# Patient Record
Sex: Female | Born: 1945 | Race: White | Hispanic: No | Smoking: Current every day smoker
Health system: Southern US, Community
[De-identification: ages and names within clinical notes are randomized; demographics above are authoritative.]

## PROBLEM LIST (undated history)

## (undated) DIAGNOSIS — H9190 Unspecified hearing loss, unspecified ear: Secondary | ICD-10-CM

## (undated) DIAGNOSIS — J3489 Other specified disorders of nose and nasal sinuses: Secondary | ICD-10-CM

## (undated) DIAGNOSIS — J342 Deviated nasal septum: Secondary | ICD-10-CM

## (undated) DIAGNOSIS — C4490 Unspecified malignant neoplasm of skin, unspecified: Secondary | ICD-10-CM

## (undated) DIAGNOSIS — F32A Depression, unspecified: Secondary | ICD-10-CM

## (undated) DIAGNOSIS — M25562 Pain in left knee: Secondary | ICD-10-CM

## (undated) DIAGNOSIS — M7120 Synovial cyst of popliteal space [Baker], unspecified knee: Secondary | ICD-10-CM

## (undated) DIAGNOSIS — F329 Major depressive disorder, single episode, unspecified: Secondary | ICD-10-CM

## (undated) DIAGNOSIS — G4730 Sleep apnea, unspecified: Secondary | ICD-10-CM

## (undated) DIAGNOSIS — K5792 Diverticulitis of intestine, part unspecified, without perforation or abscess without bleeding: Principal | ICD-10-CM

## (undated) HISTORY — PX: NECK SURGERY: SHX720

## (undated) HISTORY — DX: Diverticulitis of intestine, part unspecified, without perforation or abscess without bleeding: K57.92

## (undated) HISTORY — PX: MOHS SURGERY: SHX181

## (undated) HISTORY — PX: APPENDECTOMY: SHX54

## (undated) HISTORY — PX: BACK SURGERY: SHX140

## (undated) HISTORY — PX: BREAST SURGERY: SHX581

## (undated) HISTORY — PX: EYE SURGERY: SHX253

## (undated) HISTORY — DX: Pain in left knee: M25.562

## (undated) HISTORY — PX: FOOT SURGERY: SHX648

---

## 2002-02-14 ENCOUNTER — Emergency Department (HOSPITAL_COMMUNITY): Admission: EM | Admit: 2002-02-14 | Discharge: 2002-02-14 | Payer: Self-pay | Admitting: Emergency Medicine

## 2002-06-02 ENCOUNTER — Encounter: Admission: RE | Admit: 2002-06-02 | Discharge: 2002-08-05 | Payer: Self-pay | Admitting: Orthopedic Surgery

## 2011-10-23 DIAGNOSIS — Z720 Tobacco use: Secondary | ICD-10-CM | POA: Insufficient documentation

## 2011-10-23 DIAGNOSIS — F419 Anxiety disorder, unspecified: Secondary | ICD-10-CM | POA: Insufficient documentation

## 2011-10-23 DIAGNOSIS — G8929 Other chronic pain: Secondary | ICD-10-CM | POA: Insufficient documentation

## 2011-11-13 DIAGNOSIS — E559 Vitamin D deficiency, unspecified: Secondary | ICD-10-CM | POA: Insufficient documentation

## 2011-11-16 DIAGNOSIS — M8580 Other specified disorders of bone density and structure, unspecified site: Secondary | ICD-10-CM | POA: Insufficient documentation

## 2011-11-19 DIAGNOSIS — M1610 Unilateral primary osteoarthritis, unspecified hip: Secondary | ICD-10-CM | POA: Insufficient documentation

## 2011-11-25 ENCOUNTER — Encounter (HOSPITAL_BASED_OUTPATIENT_CLINIC_OR_DEPARTMENT_OTHER): Payer: Self-pay | Admitting: *Deleted

## 2011-11-25 ENCOUNTER — Emergency Department (HOSPITAL_BASED_OUTPATIENT_CLINIC_OR_DEPARTMENT_OTHER)
Admission: EM | Admit: 2011-11-25 | Discharge: 2011-11-25 | Disposition: A | Discharge status: Home / Self Care | Payer: Medicare Other | Attending: Emergency Medicine | Admitting: Emergency Medicine

## 2011-11-25 ENCOUNTER — Emergency Department (HOSPITAL_BASED_OUTPATIENT_CLINIC_OR_DEPARTMENT_OTHER): Payer: Medicare Other

## 2011-11-25 DIAGNOSIS — S5010XA Contusion of unspecified forearm, initial encounter: Secondary | ICD-10-CM | POA: Insufficient documentation

## 2011-11-25 DIAGNOSIS — Z79899 Other long term (current) drug therapy: Secondary | ICD-10-CM | POA: Insufficient documentation

## 2011-11-25 DIAGNOSIS — R51 Headache: Secondary | ICD-10-CM | POA: Insufficient documentation

## 2011-11-25 DIAGNOSIS — R079 Chest pain, unspecified: Secondary | ICD-10-CM | POA: Insufficient documentation

## 2011-11-25 DIAGNOSIS — S40029A Contusion of unspecified upper arm, initial encounter: Secondary | ICD-10-CM

## 2011-11-25 HISTORY — DX: Other specified disorders of nose and nasal sinuses: J34.89

## 2011-11-25 MED ORDER — HYDROCODONE-ACETAMINOPHEN 5-325 MG PO TABS
1.0000 | ORAL_TABLET | Freq: Once | ORAL | Status: AC
  Administered 2011-11-25: 1 via ORAL
  Filled 2011-11-25: qty 1

## 2011-11-25 MED ORDER — HYDROCODONE-ACETAMINOPHEN 5-325 MG PO TABS: 1.0000 | ORAL_TABLET | Freq: Four times a day (QID) | ORAL | Status: AC | PRN

## 2011-11-25 MED ORDER — IBUPROFEN 800 MG PO TABS: 800.0000 mg | ORAL_TABLET | Freq: Three times a day (TID) | ORAL | Status: AC

## 2011-11-25 MED ORDER — METHOCARBAMOL 500 MG PO TABS: 500.0000 mg | ORAL_TABLET | Freq: Two times a day (BID) | ORAL | Status: AC

## 2011-11-25 MED ORDER — METHOCARBAMOL 500 MG PO TABS
500.0000 mg | ORAL_TABLET | Freq: Once | ORAL | Status: AC
  Administered 2011-11-25: 500 mg via ORAL
  Filled 2011-11-25: qty 1
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
## 2011-11-26 NOTE — ED Provider Notes (Signed)
History/physical exam/procedure(s) were performed by non-physician practitioner and as supervising physician I was immediately available for consultation/collaboration. I have reviewed all notes and am in agreement with care and plan.   Deva Ron S Marjon Doxtater, MD 11/26/11 0757 

## 2012-01-18 DIAGNOSIS — J449 Chronic obstructive pulmonary disease, unspecified: Secondary | ICD-10-CM | POA: Insufficient documentation

## 2012-01-18 DIAGNOSIS — G4733 Obstructive sleep apnea (adult) (pediatric): Secondary | ICD-10-CM | POA: Insufficient documentation

## 2012-05-05 ENCOUNTER — Emergency Department (HOSPITAL_BASED_OUTPATIENT_CLINIC_OR_DEPARTMENT_OTHER)
Admission: EM | Admit: 2012-05-05 | Discharge: 2012-05-05 | Disposition: A | Discharge status: Home / Self Care | Payer: Medicare Other | Attending: Emergency Medicine | Admitting: Emergency Medicine

## 2012-05-05 ENCOUNTER — Encounter (HOSPITAL_BASED_OUTPATIENT_CLINIC_OR_DEPARTMENT_OTHER): Payer: Self-pay | Admitting: Emergency Medicine

## 2012-05-05 DIAGNOSIS — Z9889 Other specified postprocedural states: Secondary | ICD-10-CM | POA: Insufficient documentation

## 2012-05-05 DIAGNOSIS — Z8669 Personal history of other diseases of the nervous system and sense organs: Secondary | ICD-10-CM | POA: Insufficient documentation

## 2012-05-05 DIAGNOSIS — Z7982 Long term (current) use of aspirin: Secondary | ICD-10-CM | POA: Insufficient documentation

## 2012-05-05 DIAGNOSIS — H209 Unspecified iridocyclitis: Secondary | ICD-10-CM | POA: Insufficient documentation

## 2012-05-05 DIAGNOSIS — F17200 Nicotine dependence, unspecified, uncomplicated: Secondary | ICD-10-CM | POA: Insufficient documentation

## 2012-05-05 DIAGNOSIS — Z79899 Other long term (current) drug therapy: Secondary | ICD-10-CM | POA: Insufficient documentation

## 2012-05-05 MED ORDER — OXYCODONE-ACETAMINOPHEN 5-325 MG PO TABS: 2.0000 | ORAL_TABLET | ORAL | Status: DC | PRN

## 2012-05-05 MED ORDER — MOXIFLOXACIN HCL 0.5 % OP SOLN: 1.0000 [drp] | Freq: Three times a day (TID) | OPHTHALMIC | Status: DC

## 2012-05-05 MED ORDER — TETRACAINE HCL 0.5 % OP SOLN
OPHTHALMIC | Status: AC
  Administered 2012-05-05: 17:00:00
  Filled 2012-05-05: qty 2

## 2012-05-05 MED ORDER — FLUORESCEIN SODIUM 1 MG OP STRP
ORAL_STRIP | OPHTHALMIC | Status: AC
  Administered 2012-05-05: 17:00:00
  Filled 2012-05-05: qty 1

## 2012-05-05 MED ORDER — CYCLOPENTOLATE HCL 1 % OP SOLN
1.0000 [drp] | Freq: Once | OPHTHALMIC | Status: AC
  Administered 2012-05-05: 1 [drp] via OPHTHALMIC
  Filled 2012-05-05: qty 2

## 2012-05-05 NOTE — ED Notes (Signed)
Started yesterday with right eye draining, redness and irritation.

## 2012-05-05 NOTE — ED Provider Notes (Signed)
History   This chart was scribed for Rolan Bucco, MD by Albertha Ghee Rifaie. This patient was seen in room MH05/MH05 and the patient's care was started at 1625.   CSN: [id number]  Arrival date & time 05/05/12  1559   First MD Initiated Contact with Patient 05/05/12 1625      Chief Complaint  Patient presents with  . Eye Problem     The history is provided by the patient. No language interpreter was used.    [name] [name] is a 66 y.o. female who presents to the Emergency Department complaining of gradually worsening, constant, non-radiating, moderate right eye pain onset one day ago. There is associated clear nonpurulent eye discharge, swelling, redness, and photophobia. Patient denies any eye matting. Patient  has taken tramadol at home with no relief. She reports having similar symptoms before and she has taken amoxicillin and eye drops that alleviated that irritation. She has a history of corneal membrane disease and eye surgery. She says she usually wears a bandage lens in her right eye. Pt is a current everyday smoker but denies alcohol use.  She is not sure if her vision is changed from baseline   Past Medical History  Diagnosis Date  . Sinus drainage     Past Surgical History  Procedure Date  . Appendectomy   . Breast surgery   . Neck surgery   . Back surgery   . Cesarean section   . Eye surgery   . Foot surgery     No family history on file.  History  Substance Use Topics  . Smoking status: Current Every Day Smoker  . Smokeless tobacco: Not on file  . Alcohol Use: No    OB History    Grav Para Term Preterm Abortions TAB SAB Ect Mult Living                  Review of Systems  Constitutional: Negative for fever, chills, diaphoresis and fatigue.  HENT: Negative for congestion, rhinorrhea and sneezing.   Eyes: Positive for photophobia, pain, discharge and redness.  Respiratory: Negative for cough, chest tightness and shortness of breath.     Cardiovascular: Negative for chest pain and leg swelling.  Gastrointestinal: Negative for nausea, vomiting, abdominal pain, diarrhea and blood in stool.  Genitourinary: Negative for frequency, hematuria, flank pain and difficulty urinating.  Musculoskeletal: Negative for back pain and arthralgias.  Skin: Negative for rash.  Neurological: Negative for dizziness, speech difficulty, weakness, numbness and headaches.    Allergies  Tape  Home Medications   Current Outpatient Rx  Name  Route  Sig  Dispense  Refill  . TRAMADOL HCL 50 MG PO TABS   Oral   Take 50 mg by mouth as needed.         . ASPIRIN 81 MG PO TABS   Oral   Take 81 mg by mouth 2 (two) times daily.         . BUPROPION HCL ER (SR) 150 MG PO TB12   Oral   Take 150 mg by mouth 2 (two) times daily.         Marland Kitchen CETIRIZINE HCL 10 MG PO TABS   Oral   Take 10 mg by mouth daily.         Marland Kitchen CITALOPRAM HYDROBROMIDE 20 MG PO TABS   Oral   Take 20 mg by mouth daily.         Marland Kitchen MOXIFLOXACIN HCL 0.5 % OP SOLN  Right Eye   Place 1 drop into the right eye 3 (three) times daily.   3 mL   0   . OXYCODONE-ACETAMINOPHEN 5-325 MG PO TABS   Oral   Take 2 tablets by mouth every 4 (four) hours as needed for pain.   15 tablet   0     BP 147/78  Pulse 77  Temp 98 F (36.7 C) (Oral)  Resp 20  SpO2 100%  Physical Exam  Constitutional: She is oriented to person, place, and time. She appears well-developed and well-nourished.  HENT:  Head: Normocephalic and atraumatic.  Mouth/Throat: Oropharynx is clear and moist.  Eyes: EOM are normal. Pupils are equal, round, and reactive to light. Right eye exhibits discharge (watery). Right eye exhibits no exudate and no hordeolum. No foreign body present in the right eye. Right conjunctiva is injected.  Slit lamp exam:      The right eye shows no corneal abrasion, no corneal ulcer, no hyphema and no fluorescein uptake.       Pressure 15 in right eye with tonopen  Neck: Normal  range of motion. Neck supple.  Musculoskeletal: Normal range of motion.  Neurological: She is alert and oriented to person, place, and time.  Skin: Skin is warm and dry.    ED Course  Procedures (including critical care time)  Labs Reviewed - No data to display No results found.  DIAGNOSTIC STUDIES: Oxygen Saturation is 200% on room air, normal by my interpretation.    COORDINATION OF CARE: 4:52 PM Discussed treatment plan with pt at bedside and pt agreed to plan.    1. Iritis       MDM  Pt with watery discharge, photophobia.  Vision decreased in right eye, but pt was not wearing her contact lens.  No corneal abrasion noted.  Suspect iritis.  Discussed with opthamologist at [facility] where pt says that she receives her care and agreed with plan to start abx eye drops, pt to call in the am for appt tomorrow.  Advised not to wear contact lens until she sees opthamologist      I personally performed the services described in this documentation, which was scribed in my presence.  The recorded information has been reviewed and considered.    Rolan Bucco, MD 05/05/12 [phone]

## 2012-05-05 NOTE — ED Notes (Signed)
Patient had sat in her room until her eye stopped burning & she stated that it felt better and that she would not have any trouble driving. Walked patient to front

## 2012-05-15 DIAGNOSIS — B0052 Herpesviral keratitis: Secondary | ICD-10-CM | POA: Insufficient documentation

## 2012-07-03 DIAGNOSIS — K5792 Diverticulitis of intestine, part unspecified, without perforation or abscess without bleeding: Secondary | ICD-10-CM

## 2012-07-03 HISTORY — DX: Diverticulitis of intestine, part unspecified, without perforation or abscess without bleeding: K57.92

## 2012-11-14 ENCOUNTER — Emergency Department (HOSPITAL_BASED_OUTPATIENT_CLINIC_OR_DEPARTMENT_OTHER): Payer: Medicare Other

## 2012-11-14 ENCOUNTER — Inpatient Hospital Stay (HOSPITAL_BASED_OUTPATIENT_CLINIC_OR_DEPARTMENT_OTHER)
Admission: EM | Admit: 2012-11-14 | Discharge: 2012-11-21 | DRG: 391 | Disposition: A | Discharge status: Home / Self Care | Payer: Medicare Other | Attending: General Surgery | Admitting: General Surgery

## 2012-11-14 ENCOUNTER — Encounter (HOSPITAL_BASED_OUTPATIENT_CLINIC_OR_DEPARTMENT_OTHER): Payer: Self-pay

## 2012-11-14 DIAGNOSIS — K859 Acute pancreatitis without necrosis or infection, unspecified: Secondary | ICD-10-CM | POA: Diagnosis present

## 2012-11-14 DIAGNOSIS — K5732 Diverticulitis of large intestine without perforation or abscess without bleeding: Principal | ICD-10-CM | POA: Diagnosis present

## 2012-11-14 DIAGNOSIS — G4733 Obstructive sleep apnea (adult) (pediatric): Secondary | ICD-10-CM | POA: Diagnosis present

## 2012-11-14 DIAGNOSIS — K851 Biliary acute pancreatitis without necrosis or infection: Secondary | ICD-10-CM

## 2012-11-14 DIAGNOSIS — R1032 Left lower quadrant pain: Secondary | ICD-10-CM | POA: Diagnosis present

## 2012-11-14 DIAGNOSIS — R748 Abnormal levels of other serum enzymes: Secondary | ICD-10-CM | POA: Diagnosis present

## 2012-11-14 DIAGNOSIS — F17200 Nicotine dependence, unspecified, uncomplicated: Secondary | ICD-10-CM | POA: Diagnosis present

## 2012-11-14 DIAGNOSIS — F329 Major depressive disorder, single episode, unspecified: Secondary | ICD-10-CM | POA: Diagnosis present

## 2012-11-14 DIAGNOSIS — R197 Diarrhea, unspecified: Secondary | ICD-10-CM | POA: Diagnosis present

## 2012-11-14 DIAGNOSIS — F3289 Other specified depressive episodes: Secondary | ICD-10-CM | POA: Diagnosis present

## 2012-11-14 DIAGNOSIS — R7401 Elevation of levels of liver transaminase levels: Secondary | ICD-10-CM

## 2012-11-14 DIAGNOSIS — R7989 Other specified abnormal findings of blood chemistry: Secondary | ICD-10-CM | POA: Diagnosis present

## 2012-11-14 DIAGNOSIS — B379 Candidiasis, unspecified: Secondary | ICD-10-CM | POA: Diagnosis present

## 2012-11-14 DIAGNOSIS — K5792 Diverticulitis of intestine, part unspecified, without perforation or abscess without bleeding: Secondary | ICD-10-CM | POA: Diagnosis present

## 2012-11-14 HISTORY — DX: Unspecified hearing loss, unspecified ear: H91.90

## 2012-11-14 HISTORY — DX: Sleep apnea, unspecified: G47.30

## 2012-11-14 HISTORY — DX: Depression, unspecified: F32.A

## 2012-11-14 HISTORY — DX: Major depressive disorder, single episode, unspecified: F32.9

## 2012-11-14 HISTORY — DX: Deviated nasal septum: J34.2

## 2012-11-14 LAB — CBC WITH DIFFERENTIAL/PLATELET
Eosinophils Absolute: 0.2 10*3/uL (ref 0.0–0.7)
Eosinophils Relative: 1 % (ref 0–5)
HCT: 39.1 % (ref 36.0–46.0)
Lymphocytes Relative: 20 % (ref 12–46)
Lymphs Abs: 2.4 10*3/uL (ref 0.7–4.0)
MCH: 31.6 pg (ref 26.0–34.0)
MCV: 90.9 fL (ref 78.0–100.0)
Monocytes Absolute: 1.1 10*3/uL — ABNORMAL HIGH (ref 0.1–1.0)
Platelets: 295 10*3/uL (ref 150–400)
RBC: 4.3 MIL/uL (ref 3.87–5.11)

## 2012-11-14 LAB — URINALYSIS, ROUTINE W REFLEX MICROSCOPIC
Bilirubin Urine: NEGATIVE
Glucose, UA: NEGATIVE mg/dL
Hgb urine dipstick: NEGATIVE
Protein, ur: 30 mg/dL — AB
Specific Gravity, Urine: 1.015 (ref 1.005–1.030)
Urobilinogen, UA: 0.2 mg/dL (ref 0.0–1.0)

## 2012-11-14 LAB — COMPREHENSIVE METABOLIC PANEL
ALT: 260 U/L — ABNORMAL HIGH (ref 0–35)
AST: 351 U/L — ABNORMAL HIGH (ref 0–37)
Albumin: 3.6 g/dL (ref 3.5–5.2)
CO2: 31 mEq/L (ref 19–32)
Chloride: 97 mEq/L (ref 96–112)
GFR calc non Af Amer: 75 mL/min — ABNORMAL LOW (ref >90)
Sodium: 138 mEq/L (ref 135–145)
Total Bilirubin: 0.7 mg/dL (ref 0.3–1.2)

## 2012-11-14 LAB — URINE MICROSCOPIC-ADD ON

## 2012-11-14 LAB — LIPASE, BLOOD: Lipase: 2023 U/L — ABNORMAL HIGH (ref 11–59)

## 2012-11-14 MED ORDER — ONDANSETRON HCL 4 MG/2ML IJ SOLN
4.0000 mg | Freq: Once | INTRAMUSCULAR | Status: AC
  Administered 2012-11-14: 4 mg via INTRAVENOUS
  Filled 2012-11-14: qty 2

## 2012-11-14 MED ORDER — PIPERACILLIN-TAZOBACTAM 3.375 G IVPB
3.3750 g | Freq: Once | INTRAVENOUS | Status: AC
  Administered 2012-11-14: 3.375 g via INTRAVENOUS

## 2012-11-14 MED ORDER — MORPHINE SULFATE 4 MG/ML IJ SOLN
4.0000 mg | Freq: Once | INTRAMUSCULAR | Status: AC
  Administered 2012-11-14: 4 mg via INTRAVENOUS
  Filled 2012-11-14: qty 1

## 2012-11-14 MED ORDER — IOHEXOL 300 MG/ML  SOLN
100.0000 mL | Freq: Once | INTRAMUSCULAR | Status: AC | PRN
  Administered 2012-11-14: 100 mL via INTRAVENOUS

## 2012-11-14 MED ORDER — IOHEXOL 300 MG/ML  SOLN
50.0000 mL | Freq: Once | INTRAMUSCULAR | Status: AC | PRN
  Administered 2012-11-14: 50 mL via ORAL

## 2012-11-14 MED ORDER — PIPERACILLIN-TAZOBACTAM 3.375 G IVPB
INTRAVENOUS | Status: AC
  Filled 2012-11-14: qty 50

## 2012-11-14 NOTE — ED Provider Notes (Signed)
Medical screening examination/treatment/procedure(s) were conducted as a shared visit with non-physician practitioner(s) and myself.  I personally evaluated the patient during the encounter   Gwyneth Sprout, MD 11/14/12 2349

## 2012-11-14 NOTE — ED Notes (Signed)
MD at bedside. 

## 2012-11-14 NOTE — ED Notes (Signed)
Left side abd pain started yesterday-n/v/d today

## 2012-11-14 NOTE — ED Provider Notes (Signed)
History     CSN: [id number]  Arrival date & time 11/14/12  2054   First MD Initiated Contact with Patient 11/14/12 2106      Chief Complaint  Patient presents with  . Abdominal Pain    (Consider location/radiation/quality/duration/timing/severity/associated sxs/prior treatment) Patient is a 67 y.o. female presenting with abdominal pain. The history is provided by the patient. No language interpreter was used.  Abdominal Pain Pain location:  LUQ Pain quality: aching   Pain radiates to:  L flank Pain severity:  Moderate Onset quality:  Gradual Duration:  2 days Timing:  Constant Progression:  Unchanged Context: not recent illness, not recent travel and not suspicious food intake   Relieved by:  Nothing Worsened by:  Nothing tried Ineffective treatments:  None tried Associated symptoms: diarrhea, nausea and vomiting   Associated symptoms: no cough and no fever     Past Medical History  Diagnosis Date  . Sinus drainage     Past Surgical History  Procedure Laterality Date  . Appendectomy    . Breast surgery    . Neck surgery    . Back surgery    . Cesarean section    . Eye surgery    . Foot surgery      No family history on file.  History  Substance Use Topics  . Smoking status: Current Every Day Smoker  . Smokeless tobacco: Not on file  . Alcohol Use: No    OB History   Grav Para Term Preterm Abortions TAB SAB Ect Mult Living                  Review of Systems  Constitutional: Negative for fever.  Respiratory: Negative for cough.   Cardiovascular: Negative.   Gastrointestinal: Positive for nausea, vomiting, abdominal pain and diarrhea.    Allergies  Tape  Home Medications   Current Outpatient Rx  Name  Route  Sig  Dispense  Refill  . ALENDRONATE SODIUM PO   Oral   Take by mouth.         . Bilberry 1000 MG CAPS   Oral   Take by mouth.         . Cholecalciferol (VITAMIN D PO)   Oral   Take by mouth.         . CHROMIUM  PICOLINATE PO   Oral   Take by mouth.         Marland Kitchen LORATADINE PO   Oral   Take by mouth.         . Magnesium 500 MG TABS   Oral   Take by mouth.         . Multiple Vitamins-Minerals (MULTIVITAMIN PO)   Oral   Take by mouth.         . vitamin B-12 (CYANOCOBALAMIN) 1000 MCG tablet   Oral   Take 1,000 mcg by mouth daily.         Marland Kitchen aspirin 81 MG tablet   Oral   Take 81 mg by mouth 2 (two) times daily.         Marland Kitchen buPROPion (WELLBUTRIN SR) 150 MG 12 hr tablet   Oral   Take 150 mg by mouth 2 (two) times daily.         . cetirizine (ZYRTEC) 10 MG tablet   Oral   Take 10 mg by mouth daily.         . citalopram (CELEXA) 20 MG tablet   Oral   Take  20 mg by mouth daily.         Marland Kitchen moxifloxacin (VIGAMOX) 0.5 % ophthalmic solution   Right Eye   Place 1 drop into the right eye 3 (three) times daily.   3 mL   0   . oxyCODONE-acetaminophen (PERCOCET/ROXICET) 5-325 MG per tablet   Oral   Take 2 tablets by mouth every 4 (four) hours as needed for pain.   15 tablet   0   . traMADol (ULTRAM) 50 MG tablet   Oral   Take 50 mg by mouth as needed.           BP 120/65  Pulse 93  Temp(Src) 98.6 F (37 C) (Oral)  Resp 20  Ht 5\' 2"  (1.575 m)  Wt 175 lb (79.379 kg)  BMI 32 kg/m2  SpO2 95%  Physical Exam  Nursing note and vitals reviewed. Constitutional: She is oriented to person, place, and time. She appears well-developed and well-nourished.  HENT:  Head: Normocephalic and atraumatic.  Eyes: Conjunctivae and EOM are normal.  Neck: Neck supple.  Cardiovascular: Normal rate and regular rhythm.   Pulmonary/Chest: Effort normal and breath sounds normal.  Abdominal: Soft. Bowel sounds are normal. There is no tenderness.  Musculoskeletal: Normal range of motion.  Neurological: She is alert and oriented to person, place, and time.  Skin: Skin is warm and dry.  Psychiatric: She has a normal mood and affect.    ED Course  Procedures (including critical care  time)  Labs Reviewed  URINALYSIS, ROUTINE W REFLEX MICROSCOPIC - Abnormal; Notable for the following:    pH 8.5 (*)    Protein, ur 30 (*)    All other components within normal limits  CBC WITH DIFFERENTIAL - Abnormal; Notable for the following:    WBC 12.1 (*)    Neutro Abs 8.5 (*)    Monocytes Absolute 1.1 (*)    All other components within normal limits  URINE MICROSCOPIC-ADD ON - Abnormal; Notable for the following:    Squamous Epithelial / LPF FEW (*)    Bacteria, UA FEW (*)    All other components within normal limits  COMPREHENSIVE METABOLIC PANEL  LIPASE, BLOOD   No results found.   No diagnosis found.    MDM  9:54 PM Results pending, will leave with Dr. Carlean Jews, NP 11/14/12 2155

## 2012-11-14 NOTE — ED Provider Notes (Signed)
Pt with left sided abd pain and on CT found to have colonic diverticulitis and gallbladder wall thickening.  Labs shows WBC's of 12,000, LFT's elevated to 200-300 and lipase is >2000 today.  Concern for both diverticulitis that is uncomplicated and gallbladder pancreatitis.  Pt given zosyn and will discuss with surgery.  Gwyneth Sprout, MD 11/14/12 2352

## 2012-11-15 ENCOUNTER — Inpatient Hospital Stay (HOSPITAL_COMMUNITY): Payer: Medicare Other

## 2012-11-15 ENCOUNTER — Encounter (HOSPITAL_BASED_OUTPATIENT_CLINIC_OR_DEPARTMENT_OTHER): Payer: Self-pay | Admitting: Emergency Medicine

## 2012-11-15 DIAGNOSIS — K5732 Diverticulitis of large intestine without perforation or abscess without bleeding: Secondary | ICD-10-CM

## 2012-11-15 LAB — CBC WITH DIFFERENTIAL/PLATELET
Eosinophils Relative: 3 % (ref 0–5)
HCT: 36.8 % (ref 36.0–46.0)
Lymphocytes Relative: 20 % (ref 12–46)
Lymphs Abs: 2.1 10*3/uL (ref 0.7–4.0)
MCV: 89.5 fL (ref 78.0–100.0)
Monocytes Absolute: 0.8 10*3/uL (ref 0.1–1.0)
Monocytes Relative: 8 % (ref 3–12)
RBC: 4.11 MIL/uL (ref 3.87–5.11)
WBC: 10.2 10*3/uL (ref 4.0–10.5)

## 2012-11-15 LAB — HEPATIC FUNCTION PANEL
ALT: 180 U/L — ABNORMAL HIGH (ref 0–35)
AST: 116 U/L — ABNORMAL HIGH (ref 0–37)
Albumin: 3.5 g/dL (ref 3.5–5.2)
Alkaline Phosphatase: 66 U/L (ref 39–117)
Bilirubin, Direct: 0.2 mg/dL (ref 0.0–0.3)
Indirect Bilirubin: 0.4 mg/dL (ref 0.3–0.9)
Total Bilirubin: 0.6 mg/dL (ref 0.3–1.2)
Total Protein: 7.1 g/dL (ref 6.0–8.3)

## 2012-11-15 MED ORDER — LORAZEPAM 2 MG/ML IJ SOLN
INTRAMUSCULAR | Status: AC
  Administered 2012-11-15: 0.5 mg via INTRAVENOUS
  Filled 2012-11-15: qty 1

## 2012-11-15 MED ORDER — METRONIDAZOLE IN NACL 5-0.79 MG/ML-% IV SOLN
500.0000 mg | Freq: Three times a day (TID) | INTRAVENOUS | Status: DC
  Administered 2012-11-15 – 2012-11-21 (×19): 500 mg via INTRAVENOUS
  Filled 2012-11-15 (×21): qty 100

## 2012-11-15 MED ORDER — CIPROFLOXACIN IN D5W 400 MG/200ML IV SOLN
400.0000 mg | Freq: Two times a day (BID) | INTRAVENOUS | Status: DC
  Administered 2012-11-15 – 2012-11-21 (×13): 400 mg via INTRAVENOUS
  Filled 2012-11-15 (×13): qty 200

## 2012-11-15 MED ORDER — LORAZEPAM 2 MG/ML IJ SOLN
0.5000 mg | Freq: Four times a day (QID) | INTRAMUSCULAR | Status: DC | PRN
  Administered 2012-11-15: 0.5 mg via INTRAVENOUS
  Administered 2012-11-15: 1 mg via INTRAVENOUS
  Administered 2012-11-16: 0.5 mg via INTRAVENOUS
  Administered 2012-11-17 – 2012-11-18 (×3): 1 mg via INTRAVENOUS
  Administered 2012-11-20: 0.5 mg via INTRAVENOUS
  Filled 2012-11-15 (×7): qty 1

## 2012-11-15 MED ORDER — LORATADINE 10 MG PO TABS
10.0000 mg | ORAL_TABLET | Freq: Every day | ORAL | Status: DC
  Administered 2012-11-16 – 2012-11-21 (×6): 10 mg via ORAL
  Filled 2012-11-15 (×7): qty 1

## 2012-11-15 MED ORDER — HYDROCODONE-ACETAMINOPHEN 5-325 MG PO TABS
1.0000 | ORAL_TABLET | ORAL | Status: DC | PRN
  Administered 2012-11-16 – 2012-11-18 (×7): 2 via ORAL
  Administered 2012-11-19: 1 via ORAL
  Administered 2012-11-19 (×2): 2 via ORAL
  Administered 2012-11-19 – 2012-11-20 (×3): 1 via ORAL
  Administered 2012-11-20 – 2012-11-21 (×2): 2 via ORAL
  Filled 2012-11-15 (×4): qty 2
  Filled 2012-11-15: qty 1
  Filled 2012-11-15 (×6): qty 2
  Filled 2012-11-15: qty 1
  Filled 2012-11-15 (×2): qty 2

## 2012-11-15 MED ORDER — BUPROPION HCL ER (SR) 150 MG PO TB12
150.0000 mg | ORAL_TABLET | Freq: Two times a day (BID) | ORAL | Status: DC
  Administered 2012-11-15 – 2012-11-21 (×12): 150 mg via ORAL
  Filled 2012-11-15 (×17): qty 1

## 2012-11-15 MED ORDER — LORATADINE 10 MG PO TABS: 10.0000 mg | ORAL_TABLET | Freq: Every morning | ORAL | Status: DC

## 2012-11-15 MED ORDER — ONDANSETRON HCL 4 MG/2ML IJ SOLN
4.0000 mg | Freq: Once | INTRAMUSCULAR | Status: AC
Start: 2012-11-15 — End: 2012-11-15
  Administered 2012-11-15: 4 mg via INTRAVENOUS
  Filled 2012-11-15: qty 2

## 2012-11-15 MED ORDER — HYDROMORPHONE HCL PF 1 MG/ML IJ SOLN
0.5000 mg | Freq: Once | INTRAMUSCULAR | Status: AC
  Administered 2012-11-15: 0.5 mg via INTRAVENOUS
  Filled 2012-11-15: qty 1

## 2012-11-15 MED ORDER — POTASSIUM CHLORIDE IN NACL 20-0.45 MEQ/L-% IV SOLN
INTRAVENOUS | Status: DC
  Administered 2012-11-15 – 2012-11-19 (×5): via INTRAVENOUS
  Filled 2012-11-15 (×8): qty 1000

## 2012-11-15 MED ORDER — MORPHINE SULFATE 2 MG/ML IJ SOLN
1.0000 mg | INTRAMUSCULAR | Status: DC | PRN
  Administered 2012-11-15 – 2012-11-19 (×5): 2 mg via INTRAVENOUS
  Filled 2012-11-15 (×5): qty 1

## 2012-11-15 MED ORDER — ALBUTEROL SULFATE HFA 108 (90 BASE) MCG/ACT IN AERS
2.0000 | INHALATION_SPRAY | Freq: Four times a day (QID) | RESPIRATORY_TRACT | Status: DC | PRN
  Filled 2012-11-15: qty 6.7

## 2012-11-15 MED ORDER — ONDANSETRON HCL 4 MG/2ML IJ SOLN
4.0000 mg | Freq: Four times a day (QID) | INTRAMUSCULAR | Status: DC | PRN
  Administered 2012-11-15 – 2012-11-19 (×8): 4 mg via INTRAVENOUS
  Filled 2012-11-15 (×10): qty 2

## 2012-11-15 MED ORDER — LACTATED RINGERS IV SOLN
INTRAVENOUS | Status: DC
  Administered 2012-11-15: 03:00:00 via INTRAVENOUS

## 2012-11-15 MED ORDER — NICOTINE 14 MG/24HR TD PT24
14.0000 mg | MEDICATED_PATCH | Freq: Every day | TRANSDERMAL | Status: DC
  Administered 2012-11-15 – 2012-11-21 (×7): 14 mg via TRANSDERMAL
  Filled 2012-11-15 (×7): qty 1

## 2012-11-15 MED ORDER — HEPARIN SODIUM (PORCINE) 5000 UNIT/ML IJ SOLN
5000.0000 [IU] | Freq: Three times a day (TID) | INTRAMUSCULAR | Status: DC
  Administered 2012-11-15 – 2012-11-21 (×18): 5000 [IU] via SUBCUTANEOUS
  Filled 2012-11-15 (×22): qty 1

## 2012-11-15 MED ORDER — CITALOPRAM HYDROBROMIDE 40 MG PO TABS
40.0000 mg | ORAL_TABLET | Freq: Every morning | ORAL | Status: DC
  Administered 2012-11-16 – 2012-11-21 (×6): 40 mg via ORAL
  Filled 2012-11-15 (×9): qty 1

## 2012-11-15 NOTE — Progress Notes (Signed)
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 Patient ask if we could get her equipment company to bring her a nasal prong into the hospital. She was disappointed that we could not accommodate her with a nasal prong mask. I asked her if someone could bring her tubing and mask to the hospital so she could wear the CPAP. She said that she would proberly like to have her own machine also. I talked with her RN and asked her to call physician to get an order saying that she may have her own machine and equipment. Patient seemed okay after I explained what we could do.

## 2012-11-15 NOTE — ED Notes (Signed)
MD at bedside. 

## 2012-11-15 NOTE — ED Notes (Signed)
Pt rec'd from [facility] via [facility].  Vitals stable in route.  No change in pt condition.

## 2012-11-15 NOTE — Progress Notes (Signed)
Patient interviewed and examined, agree with PA note above. Her pain is all in the left lower quadrant consistent with diverticulitis. She is still quite tender here as I would expect early in her treatment.  Amylase and LFTs are down today. Ultrasound of her gallbladder was negative for gallstones. She has not used alcohol she has no other identifiable cause for pancreatitis. Observe for now. Mariella Saa MD, FACS  11/15/2012 3:49 PM
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
## 2012-11-15 NOTE — Progress Notes (Signed)
Patient ID: [name] [name], female   DOB: Sep 04, 1945, 67 y.o.   MRN: [id number]    Subjective: Pt reports still having left flank pain that's radiating to back, feels like she also needs to belch, somewhat "queazy" but denies vomiting  Objective: Vital signs in last 24 hours: Temp:  [97.6 F (36.4 C)-98.7 F (37.1 C)] 97.6 F (36.4 C) (05/16 0451) Pulse Rate:  [64-93] 71 (05/16 0451) Resp:  [16-20] 18 (05/16 0451) BP: (114-141)/(60-83) 141/83 mmHg (05/16 0451) SpO2:  [92 %-96 %] 92 % (05/16 0451) Weight:  [175 lb (79.379 kg)] 175 lb (79.379 kg) (05/15 2104) Last BM Date: 11/14/12  Intake/Output from previous day:   Intake/Output this shift:    PE: Abd: very tender with rebound tenderness along left flank, +bs, non distended General: awake, NAD Lungs: CTA bil Heart: RRR  Lab Results:   Recent Labs  11/14/12 2127 11/15/12 0630  WBC 12.1* 10.2  HGB 13.6 12.5  HCT 39.1 36.8  PLT 295 268   BMET  Recent Labs  11/14/12 2127  NA 138  K 3.7  CL 97  CO2 31  GLUCOSE 133*  BUN 9  CREATININE 0.80  CALCIUM 9.4   PT/INR No results found for this basename: LABPROT, INR,  in the last 72 hours CMP     Component Value Date/Time   NA 138 11/14/2012 2127   K 3.7 11/14/2012 2127   CL 97 11/14/2012 2127   CO2 31 11/14/2012 2127   GLUCOSE 133* 11/14/2012 2127   BUN 9 11/14/2012 2127   CREATININE 0.80 11/14/2012 2127   CALCIUM 9.4 11/14/2012 2127   PROT 7.1 11/15/2012 0805   ALBUMIN 3.5 11/15/2012 0805   AST 116* 11/15/2012 0805   ALT 180* 11/15/2012 0805   ALKPHOS 66 11/15/2012 0805   BILITOT 0.6 11/15/2012 0805   GFRNONAA 75* 11/14/2012 2127   GFRAA 86* 11/14/2012 2127   Lipase     Component Value Date/Time   LIPASE 90* 11/15/2012 0630       Studies/Results: Ct Abdomen Pelvis W Contrast  11/14/2012   *RADIOLOGY REPORT*  Clinical Data: Left-sided abdominal pain.  CT ABDOMEN AND PELVIS WITH CONTRAST  Technique:  Multidetector CT imaging of the abdomen and pelvis was  performed following the standard protocol during bolus administration of intravenous contrast.  Contrast: 50mL OMNIPAQUE IOHEXOL 300 MG/ML  SOLN, OMNIPAQUE IOHEXOL 300 MG/ML  SOLN  Comparison: None.  Findings: Coronary artery calcifications. Lung bases are clear.  No effusions.  Heart is normal size.  Mild diffuse fatty infiltration of the liver.  Area of focal wall thickening within the gallbladder fundus may reflect area of focal adenomyomatosis.  No focal lesion in the liver.  Spleen, pancreas, adrenals, kidneys are normal.  Descending colonic and sigmoid diverticulosis.  Inflammatory change around the distal descending colon compatible with active diverticulitis.  Uterus, adnexa urinary bladder are normal.  Small bowel is decompressed.  Aorta and iliac vessels are heavily calcified, non-aneurysmal. No acute bony abnormality.  Degenerative changes in the lower lumbar spine.  IMPRESSION: Descending colonic and sigmoid diverticulosis.  Distal descending colonic diverticulitis.   Original Report Authenticated By: Charlett Nose, M.D.    Anti-infectives: Anti-infectives   Start     Dose/Rate Route Frequency Ordered Stop   11/15/12 0600  ciprofloxacin (CIPRO) IVPB 400 mg     400 mg 200 mL/hr over 60 Minutes Intravenous Every 12 hours 11/15/12 0459     11/15/12 0600  metroNIDAZOLE (FLAGYL) IVPB 500 mg  500 mg 100 mL/hr over 60 Minutes Intravenous 3 times per day 11/15/12 0459     11/15/12 0000  piperacillin-tazobactam (ZOSYN) IVPB 3.375 g     3.375 g 12.5 mL/hr over 240 Minutes Intravenous  Once 11/14/12 2347 11/15/12 0024       Assessment/Plan 1. Diverticulitis, uncomlicated: on cipro and flagyl, wbc normal now, keep on bowel rest until less pain then can advance diet, IVfs, pain control 2. Elevated Lipase/transamilase: improved today, pancrease was Ok on CT, for abd u/s today to check for stones, keep on bowel rest for now, will follow up after ultrasound  LOS: 1 day    Selestino Nila,  Samyak Sackmann 11/15/2012

## 2012-11-15 NOTE — Care Management Note (Deleted)
Page 1 of 2   11/15/2012     12:27:41 PM Doristine Mango, PA-C Physician Assistant Cosign Needed  Progress Notes Service date: 11/15/2012 8:47 AM  Patient ID: [name] [name], female   DOB: Aug 17, 1945, 67 y.o.   MRN: [id number] Subjective: Pt reports still having left flank pain that's radiating to back, feels like she also needs to belch, somewhat "queazy" but denies vomiting   Objective: Vital signs in last 24 hours: Temp:  [97.6 F (36.4 C)-98.7 F (37.1 C)] 97.6 F (36.4 C) (05/16 0451) Pulse Rate:  [64-93] 71 (05/16 0451) Resp:  [16-20] 18 (05/16 0451) BP: (114-141)/(60-83) 141/83 mmHg (05/16 0451) SpO2:  [92 %-96 %] 92 % (05/16 0451) Weight:  [175 lb (79.379 kg)] 175 lb (79.379 kg) (05/15 2104) Last BM Date: 11/14/12   Intake/Output from previous day: Intake/Output this shift:   PE: Abd: very tender with rebound tenderness along left flank, +bs, non distended General: awake, NAD Lungs: CTA bil Heart: RRR   Lab Results:  Recent Labs   11/14/12 2127  11/15/12 0630   WBC  12.1*  10.2   HGB  13.6  12.5   HCT  39.1  36.8   PLT  295  268    BMET Recent Labs   11/14/12 2127   NA  138   K  3.7   CL  97   CO2  31   GLUCOSE  133*   BUN  9   CREATININE  0.80   CALCIUM  9.4    PT/INR No results found for this basename: LABPROT, INR,  in the last 72 hours CMP        Component  Value  Date/Time     NA  138  11/14/2012 2127     K  3.7  11/14/2012 2127     CL  97  11/14/2012 2127     CO2  31  11/14/2012 2127     GLUCOSE  133*  11/14/2012 2127     BUN  9  11/14/2012 2127     CREATININE  0.80  11/14/2012 2127     CALCIUM  9.4  11/14/2012 2127     PROT  7.1  11/15/2012 0805     ALBUMIN  3.5  11/15/2012 0805     AST  116*  11/15/2012 0805     ALT  180*  11/15/2012 0805     ALKPHOS  66  11/15/2012 0805     BILITOT  0.6  11/15/2012 0805     GFRNONAA  75*  11/14/2012 2127     GFRAA  86*  11/14/2012 2127    Lipase      Component  Value  Date/Time     LIPASE  90*   11/15/2012 0630            Studies/Results: Ct Abdomen Pelvis W Contrast   11/14/2012   *RADIOLOGY REPORT*  Clinical Data: Left-sided abdominal pain.  CT ABDOMEN AND PELVIS WITH CONTRAST  Technique:  Multidetector CT imaging of the abdomen and pelvis was performed following the standard protocol during bolus administration of intravenous contrast.  Contrast: 50mL OMNIPAQUE IOHEXOL 300 MG/ML  SOLN, OMNIPAQUE IOHEXOL 300 MG/ML  SOLN  Comparison: None.  Findings: Coronary artery calcifications. Lung bases are clear.  No effusions.  Heart is normal size.  Mild diffuse fatty infiltration of the liver.  Area of focal wall thickening within the gallbladder fundus may reflect area of focal adenomyomatosis.  No  focal lesion in the liver.  Spleen, pancreas, adrenals, kidneys are normal.  Descending colonic and sigmoid diverticulosis.  Inflammatory change around the distal descending colon compatible with active diverticulitis.  Uterus, adnexa urinary bladder are normal.  Small bowel is decompressed.  Aorta and iliac vessels are heavily calcified, non-aneurysmal. No acute bony abnormality.  Degenerative changes in the lower lumbar spine.  IMPRESSION: Descending colonic and sigmoid diverticulosis.  Distal descending colonic diverticulitis.   Original Report Authenticated By: Charlett Nose, M.D.     Anti-infectives: Anti-infectives     Start        Dose/Rate  Route  Frequency  Ordered  Stop     11/15/12 0600    ciprofloxacin (CIPRO) IVPB 400 mg        400 mg 200 mL/hr over 60 Minutes  Intravenous  Every 12 hours  11/15/12 0459        11/15/12 0600    metroNIDAZOLE (FLAGYL) IVPB 500 mg        500 mg 100 mL/hr over 60 Minutes  Intravenous  3 times per day  11/15/12 0459        11/15/12 0000    piperacillin-tazobactam (ZOSYN) IVPB 3.375 g        3.375 g 12.5 mL/hr over 240 Minutes  Intravenous   Once  11/14/12 2347  11/15/12 0024           Assessment/Plan 1. Diverticulitis, uncomlicated: on  cipro and flagyl, wbc normal now, keep on bowel rest until less pain then can advance diet, IVfs, pain control 2. Elevated Lipase/transamilase: improved today, pancrease was Ok on CT, for abd u/s today to check for stones, keep on bowel rest for now, will follow up after ultrasound LOS: 1 day      [name], [name] 11/15/2012          CARE MANAGEMENT NOTE 11/15/2012  Patient:  [name],[name]   Account Number:  [id number]  Date Initiated:  11/15/2012  Documentation initiated by:  Lorenda Ishihara  Subjective/Objective Assessment:   67 yo female admitted with diverticulitis, elevated Lipase. PTA lived at home with alone     Action/Plan:   Home when stable   Anticipated DC Date:  11/18/2012   Anticipated DC Plan:  HOME/SELF CARE      DC Planning Services  CM consult      Choice offered to / List presented to:             Status of service:  Completed, signed off Medicare Important Message given?   (If response is "NO", the following Medicare IM given date fields will be blank) Date Medicare IM given:   Date Additional Medicare IM given:    Discharge Disposition:  HOME/SELF CARE  Per UR Regulation:  Reviewed for med. necessity/level of care/duration of stay  If discussed at Long Length of Stay Meetings, dates discussed:    Comments:

## 2012-11-15 NOTE — Care Management Note (Addendum)
    Page 1 of 1   11/18/2012     5:06:20 PM   CARE MANAGEMENT NOTE 11/18/2012  Patient:  [name] [name], [name] [name]   Account Number:  [id number]  Date Initiated:  11/15/2012  Documentation initiated by:  Lorenda Ishihara  Subjective/Objective Assessment:   67 yo female admitted with diverticulitis, elevated Lipase. PTA lived at home with alone     Action/Plan:   Home when stable   Anticipated DC Date:  11/20/2012   Anticipated DC Plan:  HOME/SELF CARE      DC Planning Services  CM consult      Choice offered to / List presented to:             Status of service:  Completed, signed off Medicare Important Message given?   (If response is "NO", the following Medicare IM given date fields will be blank) Date Medicare IM given:   Date Additional Medicare IM given:    Discharge Disposition:  HOME/SELF CARE  Per UR Regulation:  Reviewed for med. necessity/level of care/duration of stay  If discussed at Long Length of Stay Meetings, dates discussed:    Comments:  11/18/12 Tannis Burstein RN,BSN NCM [phone] DIARRHEA,L LOWER QUAD ABD PAIN.IV ABX.

## 2012-11-15 NOTE — ED Notes (Signed)
Attempted to call report to [facility] ED but no nurses were available to take the call at this time due to multiple emergency situations.  Will call back.

## 2012-11-15 NOTE — H&P (Addendum)
Re:   [name] [name] DOB:   1946-04-17 MRN:   [id number]  ASSESSMENT AND PLAN: 1.  Colonic diverticulitis at the junction of the left colon and sigmoid colon  Uncomplicated.  Plan bowel rest, antibiotics, IVF, and repeat exams. 1.  Elevated lipase  Normal pancreas by CT.  Though I am not sure that I have seen a lipase level this high from diverticulitis, it could be due to bowel injury.  Plan: Will get [place] of gall bladder to look for stones and repeat lipase this AM  3.  History of depression 4.  Smokes cigarettes 5.  Sleep apnea  On CPAP x 1 year  Sees Dr. William Hamburger in [place] 6.  Decreased hearing  Particularly in left ear 7.  Occasional neck pain  Old surgery - 1984  Chief Complaint  Patient presents with  . Abdominal Pain   REFERRING PHYSICIAN: Dr. Gwyneth Sprout, MD, [facility]  HISTORY OF PRESENT ILLNESS: [name] [name] is a 67 y.o. (DOB: 1945/08/28)  white  female whose primary care physician is Dr. Lonell Face [facility]) and comes to [facility] with left sided abdominal pain and vomiting.  She gives a somewhat wandering history of illness, this may be in part that her poor hearing makes her miss some of my questions.   The patient has had "indigestion" for 4 to 6 months.  Her symptoms have been helped with antacids.  Then Thursday, 11/14/2012, she developed LLQ that radiated to her back.  She had nausea and vomited.   Then as this resolved she had multiple loose stools.  She has not prior GI history.  She has never had a colonoscopy.  She went to [facility].  Dr. Anitra Lauth thought that [name] [name] had pancreatitis and diverticulitis and sent her to [facility] ER for me to evaluate. Her mother had both gall bladder disease and diverticular disease - though she was a little non- specific about her mother's  Diverticular disease.  Lipase - 2023 - 11/14/2012 WBC - 12,100 - 11/14/2012 LFT's - AST - 351, ALT - 260 - 11/14/2012 CT -  Diverticulitis at the junction of left colon/sigmoid colon - 11/14/2012  Past Medical History  Diagnosis Date  . Sinus drainage   . Deviated septum   . Hearing loss   . Depression       Past Surgical History  Procedure Laterality Date  . Appendectomy    . Breast surgery    . Neck surgery    . Back surgery    . Cesarean section    . Eye surgery    . Foot surgery        Current Facility-Administered Medications  Medication Dose Route Frequency Provider Last Rate Last Dose  . HYDROmorphone (DILAUDID) injection 0.5 mg  0.5 mg Intravenous Once John-Adam Bonk, MD      . lactated ringers infusion   Intravenous Continuous John-Adam Bonk, MD      . ondansetron (ZOFRAN) injection 4 mg  4 mg Intravenous Once Jones Skene, MD       Current Outpatient Prescriptions  Medication Sig Dispense Refill  . alendronate (FOSAMAX) 70 MG tablet Take 70 mg by mouth every 7 (seven) days. Take with a full glass of water on an empty stomach on Sundays      . aspirin 81 MG tablet Take 81 mg by mouth every morning. Take an additional tablet if needed for leg cramps in the evening      .  Bilberry 1000 MG CAPS Take 1 tablet by mouth every morning.       Marland Kitchen buPROPion (WELLBUTRIN SR) 150 MG 12 hr tablet Take 150 mg by mouth 2 (two) times daily.      . cetirizine (ZYRTEC) 10 MG tablet Take 10 mg by mouth every morning.       . cholecalciferol (VITAMIN D) 1000 UNITS tablet Take 1,000 Units by mouth every morning.      . CHROMIUM PICOLINATE PO Take 1 tablet by mouth every morning.       . citalopram (CELEXA) 40 MG tablet Take 40 mg by mouth every morning.      . cyclobenzaprine (FLEXERIL) 10 MG tablet Take 10 mg by mouth 2 (two) times daily as needed for muscle spasms.      Marland Kitchen loratadine (CLARITIN) 10 MG tablet Take 10 mg by mouth every morning.      . Magnesium 500 MG TABS Take 1 tablet by mouth every morning.       . Multiple Vitamin (MULTIVITAMIN WITH MINERALS) TABS Take 1 tablet by mouth every morning.        . sodium chloride (MURO 128) 2 % ophthalmic solution Place 1 drop into both eyes every morning.      . traMADol (ULTRAM) 50 MG tablet Take 50 mg by mouth every 8 (eight) hours as needed for pain.       . vitamin B-12 (CYANOCOBALAMIN) 1000 MCG tablet Take 1,000 mcg by mouth every morning.           Allergies  Allergen Reactions  . Tape Hives    Pt can tolerate paper tape    REVIEW OF SYSTEMS: Skin:  No history of rash.  No history of abnormal moles. Infection:  No history of hepatitis or HIV.  No history of MRSA. Neurologic:  No history of stroke.  No history of seizure.  Loss of hearing in the left ear and poor hearing in the right. Cardiac:  No history of hypertension. No history of heart disease.  No history of prior cardiac catheterization.  No history of seeing a cardiologist. Pulmonary:  Smokes 1/2 ppd.  On CPAP at night for OSA.  Endocrine:  No diabetes. No thyroid disease. Gastrointestinal:  No history of stomach disease.  No history of liver disease.  No history of gall bladder disease.  No history of pancreas disease.  No history of colon disease. Urologic:  No history of kidney stones.  No history of bladder infections. Musculoskeletal:  Neck surgery around 1984.  She gets neck pain with anxiety. Hematologic:  No bleeding disorder.  No history of anemia.  Not anticoagulated. Psycho-social:  The patient is oriented.  She has a rambling history and I am not sure how well she follows my questions.  It is possible it is just her poor hearing.  SOCIAL and FAMILY HISTORY: Divorced. Works as Ship broker. One child - son, lives in [place]. "Adopted" son - [name] [name], lives in [place], took her to the ER  PHYSICAL EXAM: BP 117/60  Pulse 70  Temp(Src) 98.7 F (37.1 C) (Oral)  Resp 18  Ht 5\' 2"  (1.575 m)  Wt 175 lb (79.379 kg)  BMI 32 kg/m2  SpO2 96%  General: Older WF who is alert.  She has a somewhat rambling history. She is hard of  hearing, which makes having a discussion with her difficult.  HEENT: Normal. Pupils equal. Neck: Supple. No mass.  No thyroid mass. Lymph Nodes:  No supraclavicular or cervical nodes. Lungs: Clear to auscultation and symmetric breath sounds. Heart:  RRR. No murmur or rub. Abdomen: Soft. No mass. No hernia. Normal bowel sounds.  Midline lower abdominal incision, from C section.  RLQ scar from appendectomy.  Tender LLQ, but no peritoneal sxes. Rectal: Not done. Extremities:  Good strength and ROM  in upper and lower extremities. Neurologic:  Grossly intact to motor and sensory function. Psychiatric: Has normal mood and affect. Behavior is normal.   DATA REVIEWED: X-ray reports and Epic notes.  Ovidio Kin, MD,  [facility] [address] [place]  [address]   [place], [place]    [address] Phone:  [phone] FAX:  [phone]

## 2012-11-16 DIAGNOSIS — K859 Acute pancreatitis without necrosis or infection, unspecified: Secondary | ICD-10-CM

## 2012-11-16 MED ORDER — VITAMIN D3 25 MCG (1000 UNIT) PO TABS
1000.0000 [IU] | ORAL_TABLET | Freq: Every morning | ORAL | Status: DC
  Administered 2012-11-16 – 2012-11-21 (×6): 1000 [IU] via ORAL
  Filled 2012-11-16 (×8): qty 1

## 2012-11-16 MED ORDER — CYCLOBENZAPRINE HCL 10 MG PO TABS
10.0000 mg | ORAL_TABLET | Freq: Two times a day (BID) | ORAL | Status: DC | PRN
  Administered 2012-11-16: 10 mg via ORAL
  Filled 2012-11-16: qty 1

## 2012-11-16 MED ORDER — SODIUM CHLORIDE (HYPERTONIC) 2 % OP SOLN
1.0000 [drp] | Freq: Every morning | OPHTHALMIC | Status: DC
  Administered 2012-11-17 – 2012-11-19 (×3): 1 [drp] via OPHTHALMIC
  Filled 2012-11-16: qty 15

## 2012-11-16 MED ORDER — BUDESONIDE-FORMOTEROL FUMARATE 160-4.5 MCG/ACT IN AERO
2.0000 | INHALATION_SPRAY | Freq: Every day | RESPIRATORY_TRACT | Status: DC | PRN
  Administered 2012-11-18: 2 via RESPIRATORY_TRACT
  Filled 2012-11-16: qty 6

## 2012-11-16 NOTE — Progress Notes (Signed)
Offered pt assistance with cpap tonight.  Pt stated she brought her cpap in from home and has used it for years.  Pt will place it on herself later when ready.  No frays on cord or defects noted with home cpap machine.  RT will place call requesting [employer] to inspect machine in the morning.  Pt refused sterile water for humidity at this time.  Pt was encouraged to call RT should she need further assistance.

## 2012-11-16 NOTE — Progress Notes (Signed)
Patient ID: [name] [name], female   DOB: 09-Jul-1945, 68 y.o.   MRN: [id number]    Subjective: Still with left lower quadrant pain radiating to the back, no better and no worse. Having loose stools. No nausea or vomiting.  Objective: Vital signs in last 24 hours: Temp:  [97.3 F (36.3 C)-97.9 F (36.6 C)] 97.9 F (36.6 C) (05/17 0609) Pulse Rate:  [61-83] 78 (05/17 0609) Resp:  [18-20] 18 (05/17 0609) BP: (94-128)/(61-78) 119/78 mmHg (05/17 0609) SpO2:  [93 %-95 %] 93 % (05/17 0609) Last BM Date: 11/15/12  Intake/Output from previous day: 05/16 0701 - 05/17 0700 In: 3341.7 [P.O.:840; I.V.:2001.7; IV Piggyback:500] Out: -  Intake/Output this shift:    General appearance: alert, cooperative and no distress GI: Still localized tenderness in the left lower quadrant, minimal guarding and this seems improved  compared to yesterday. No palpable mass.  Lab Results:   Recent Labs  11/14/12 2127 11/15/12 0630  WBC 12.1* 10.2  HGB 13.6 12.5  HCT 39.1 36.8  PLT 295 268   BMET  Recent Labs  11/14/12 2127  NA 138  K 3.7  CL 97  CO2 31  GLUCOSE 133*  BUN 9  CREATININE 0.80  CALCIUM 9.4   Lipase     Component Value Date/Time   LIPASE 90* 11/15/2012 0630   Lab Results  Component Value Date   ALT 180* 11/15/2012   AST 116* 11/15/2012   ALKPHOS 66 11/15/2012   BILITOT 0.6 11/15/2012    Studies/Results: US Abdomen Complete  11/15/2012   *RADIOLOGY REPORT*  Clinical Data:  Abdominal pain.  COMPLETE ABDOMINAL ULTRASOUND  Comparison:  CT scan dated 05/15 1014  Findings:  Gallbladder:  No gallstones, gallbladder wall thickening, or pericholecystic fluid. Negative sonographic Murphy's sign.  Common bile duct:  Normal.  5 mm in diameter.  Liver:  Slight increased echogenicity of the liver parenchyma consistent with slight hepatic steatosis.  No focal lesions.  IVC:  The intrahepatic portion is normal.  The infrahepatic portion is not visible but is normal on the CT scan of  11/14/2012.  Pancreas:  Normal.  Spleen:  Normal.  0.6 cm in length.  Right Kidney:  Normal.  11.3 cm in length.  Left Kidney:  Normal.  12.0 cm in length.  Abdominal aorta:  Normal.  2.4 cm in diameter.  IMPRESSION: Slight hepatic steatosis.  Normal gallbladder.   Original Report Authenticated By: Francene Boyers, M.D.   Ct Abdomen Pelvis W Contrast  11/14/2012   *RADIOLOGY REPORT*  Clinical Data: Left-sided abdominal pain.  CT ABDOMEN AND PELVIS WITH CONTRAST  Technique:  Multidetector CT imaging of the abdomen and pelvis was performed following the standard protocol during bolus administration of intravenous contrast.  Contrast: 50mL OMNIPAQUE IOHEXOL 300 MG/ML  SOLN, OMNIPAQUE IOHEXOL 300 MG/ML  SOLN  Comparison: None.  Findings: Coronary artery calcifications. Lung bases are clear.  No effusions.  Heart is normal size.  Mild diffuse fatty infiltration of the liver.  Area of focal wall thickening within the gallbladder fundus may reflect area of focal adenomyomatosis.  No focal lesion in the liver.  Spleen, pancreas, adrenals, kidneys are normal.  Descending colonic and sigmoid diverticulosis.  Inflammatory change around the distal descending colon compatible with active diverticulitis.  Uterus, adnexa urinary bladder are normal.  Small bowel is decompressed.  Aorta and iliac vessels are heavily calcified, non-aneurysmal. No acute bony abnormality.  Degenerative changes in the lower lumbar spine.  IMPRESSION: Descending colonic and sigmoid diverticulosis.  Distal descending colonic diverticulitis.   Original Report Authenticated By: Charlett Nose, M.D.    Anti-infectives: Anti-infectives   Start     Dose/Rate Route Frequency Ordered Stop   11/15/12 0600  ciprofloxacin (CIPRO) IVPB 400 mg     400 mg 200 mL/hr over 60 Minutes Intravenous Every 12 hours 11/15/12 0459     11/15/12 0600  metroNIDAZOLE (FLAGYL) IVPB 500 mg     500 mg 100 mL/hr over 60 Minutes Intravenous 3 times per day 11/15/12 0459      11/15/12 0000  piperacillin-tazobactam (ZOSYN) IVPB 3.375 g     3.375 g 12.5 mL/hr over 240 Minutes Intravenous  Once 11/14/12 2347 11/15/12 0024      Assessment/Plan: Diverticulitis left colon. Still symptomatic but white count down and abdominal exam seems improved. Continue IV antibiotics Apparently acute pancreatitis with markedly elevated lipase and some elevation of LFTs lab work now improved and no upper abdominal pain or tenderness currently. Ultrasound of the gallbladder was negative and pancreas appear normal by CT. I will repeat lipase and LFTs. Continue clear liquid diet Resume home meds    LOS: 2 days    Mostafa Yuan T 11/16/2012

## 2012-11-17 LAB — HEPATIC FUNCTION PANEL
Albumin: 3.2 g/dL — ABNORMAL LOW (ref 3.5–5.2)
Alkaline Phosphatase: 51 U/L (ref 39–117)
Total Bilirubin: 0.3 mg/dL (ref 0.3–1.2)

## 2012-11-17 NOTE — Progress Notes (Signed)
Patient ID: [name] [name], female   DOB: [birth year], 67 y.o.   MRN: [id number] Patient ID: [name] [name], female   DOB: 1946/04/30, 67 y.o.   MRN: [id number]    Subjective: Still with some left lower quadrant pain but it is now intermittent and a little less severe.. Having loose stools. No nausea or vomiting.  Objective: Vital signs in last 24 hours: Temp:  [97.5 F (36.4 C)-98.1 F (36.7 C)] 97.5 F (36.4 C) (05/18 0500) Pulse Rate:  [62-70] 62 (05/18 0500) Resp:  [16-18] 18 (05/18 0500) BP: (117-136)/(74-82) 136/82 mmHg (05/18 0500) SpO2:  [93 %-95 %] 95 % (05/18 0500) Last BM Date: 11/16/12  Intake/Output from previous day: 05/17 0701 - 05/18 0700 In: 4841.7 [P.O.:1080; I.V.:2861.7; IV Piggyback:900] Out: -  Intake/Output this shift:    General appearance: alert, cooperative and no distress GI: Still localized tenderness in the left lower quadrant, minimal guarding and this seems improved  compared to yesterday. No palpable mass.  Lab Results:   Recent Labs  11/14/12 2127 11/15/12 0630  WBC 12.1* 10.2  HGB 13.6 12.5  HCT 39.1 36.8  PLT 295 268   BMET  Recent Labs  11/14/12 2127  NA 138  K 3.7  CL 97  CO2 31  GLUCOSE 133*  BUN 9  CREATININE 0.80  CALCIUM 9.4   Lipase     Component Value Date/Time   LIPASE 18 11/17/2012 0452   Lab Results  Component Value Date   ALT 84* 11/17/2012   AST 28 11/17/2012   ALKPHOS 51 11/17/2012   BILITOT 0.3 11/17/2012    Studies/Results: US Abdomen Complete  11/15/2012   *RADIOLOGY REPORT*  Clinical Data:  Abdominal pain.  COMPLETE ABDOMINAL ULTRASOUND  Comparison:  CT scan dated 05/15 1014  Findings:  Gallbladder:  No gallstones, gallbladder wall thickening, or pericholecystic fluid. Negative sonographic Murphy's sign.  Common bile duct:  Normal.  5 mm in diameter.  Liver:  Slight increased echogenicity of the liver parenchyma consistent with slight hepatic steatosis.  No focal lesions.  IVC:  The intrahepatic portion  is normal.  The infrahepatic portion is not visible but is normal on the CT scan of 11/14/2012.  Pancreas:  Normal.  Spleen:  Normal.  0.6 cm in length.  Right Kidney:  Normal.  11.3 cm in length.  Left Kidney:  Normal.  12.0 cm in length.  Abdominal aorta:  Normal.  2.4 cm in diameter.  IMPRESSION: Slight hepatic steatosis.  Normal gallbladder.   Original Report Authenticated By: Francene Boyers, M.D.    Anti-infectives: Anti-infectives   Start     Dose/Rate Route Frequency Ordered Stop   11/15/12 0600  ciprofloxacin (CIPRO) IVPB 400 mg     400 mg 200 mL/hr over 60 Minutes Intravenous Every 12 hours 11/15/12 0459     11/15/12 0600  metroNIDAZOLE (FLAGYL) IVPB 500 mg     500 mg 100 mL/hr over 60 Minutes Intravenous 3 times per day 11/15/12 0459     11/15/12 0000  piperacillin-tazobactam (ZOSYN) IVPB 3.375 g     3.375 g 12.5 mL/hr over 240 Minutes Intravenous  Once 11/14/12 2347 11/15/12 0024      Assessment/Plan: Diverticulitis left colon. Still symptomatic but white count down and abdominal exam seems improved. Pain is somewhat improved. Continue IV antibiotics Apparently acute pancreatitis with markedly elevated lipase and some elevation of LFTs lab work now improved and no upper abdominal pain or tenderness currently. Ultrasound of the gallbladder was negative and pancreas appear normal  by CT. Repeat LFTs and lipase have normalized as above. Advanced to full liquids  Resume home meds    LOS: 3 days    Giuseppina Quinones T 11/17/2012

## 2012-11-18 MED ORDER — PANTOPRAZOLE SODIUM 40 MG IV SOLR
40.0000 mg | Freq: Every day | INTRAVENOUS | Status: DC
  Administered 2012-11-18 – 2012-11-20 (×4): 40 mg via INTRAVENOUS
  Filled 2012-11-18 (×5): qty 40

## 2012-11-18 MED ORDER — ALUM & MAG HYDROXIDE-SIMETH 200-200-20 MG/5ML PO SUSP
30.0000 mL | ORAL | Status: DC | PRN
  Administered 2012-11-18 – 2012-11-20 (×4): 30 mL via ORAL
  Filled 2012-11-18 (×4): qty 30

## 2012-11-18 NOTE — Progress Notes (Signed)
Patient ID: [name] [name], female   DOB: 20-Jan-1946, 67 y.o.   MRN: [id number]     Subjective: Pt reports several episodes of emesis.  Mild left lower quadrant abdominal pain with radiation to the back.  She continues to have diarrhea.    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  Objective: Vital signs in last 24 hours: Temp:  [97.6 F (36.4 C)-98.1 F (36.7 C)] 97.7 F (36.5 C) (05/19 0643) Pulse Rate:  [62-71] 62 (05/19 0643) Resp:  [16-18] 16 (05/19 0643) BP: (116-145)/(76-82) 116/76 mmHg (05/19 0643) SpO2:  [92 %-96 %] 92 % (05/19 0643) Last BM Date: 11/16/12  Intake/Output from previous day: 05/18 0701 - 05/19 0700 In: 1229.8 [P.O.:720; I.V.:509.8] Out: -  Intake/Output this shift:    PE: Gen:  Alert, NAD, pleasant Card:  RRR, no M/G/R heard Pulm:  CTA, no W/R/R Abd: Soft, mild TTP LLQ, /ND, +BS, no HSM, Ext:  No erythema, edema, or tenderness   CMP     Component Value Date/Time   NA 138 11/14/2012 2127   K 3.7 11/14/2012 2127   CL 97 11/14/2012 2127   CO2 31 11/14/2012 2127   GLUCOSE 133* 11/14/2012 2127   BUN 9 11/14/2012 2127   CREATININE 0.80 11/14/2012 2127   CALCIUM 9.4 11/14/2012 2127   PROT 6.2 11/17/2012 0452   ALBUMIN 3.2* 11/17/2012 0452   AST 28 11/17/2012 0452   ALT 84* 11/17/2012 0452   ALKPHOS 51 11/17/2012 0452   BILITOT 0.3 11/17/2012 0452   GFRNONAA 75* 11/14/2012 2127   GFRAA 86* 11/14/2012 2127   Lipase     Component Value Date/Time   LIPASE 18 11/17/2012 0452    Anti-infectives: Anti-infectives   Start     Dose/Rate Route Frequency Ordered Stop   11/15/12 0600  ciprofloxacin (CIPRO) IVPB 400 mg     400 mg 200 mL/hr over 60 Minutes Intravenous Every 12 hours 11/15/12 0459     11/15/12 0600  metroNIDAZOLE (FLAGYL) IVPB 500 mg     500 mg 100 mL/hr over 60 Minutes Intravenous 3 times per day 11/15/12 0459     11/15/12 0000  piperacillin-tazobactam (ZOSYN) IVPB 3.375 g     3.375 g 12.5 mL/hr over 240 Minutes  Intravenous  Once 11/14/12 2347 11/15/12 0024       Assessment/Plan Diverticulitis left colon, uncomplicated -pt still having pain and reports vomiting. Pain is under adequate control  -CL diet -IV cipro/flagyl -CBC in AM  Elevated Lipase/transamilase -normalized lipase lfts    LOS: 4 days    Ruie Sendejo ANP-BC 11/18/2012, 7:47 AM  [phone]

## 2012-11-18 NOTE — Progress Notes (Signed)
She is feeling a bit better this PM. Still with diarrhea.

## 2012-11-18 NOTE — Progress Notes (Signed)
Spoke with patient about nocturnal CPAP. Home machine in room. Patient refuses humidification at this time. She states she places herself on when ready. She is encouraged to call for RT if further assistance is needed.

## 2012-11-19 ENCOUNTER — Other Ambulatory Visit (HOSPITAL_COMMUNITY): Payer: Medicare Other

## 2012-11-19 DIAGNOSIS — R7401 Elevation of levels of liver transaminase levels: Secondary | ICD-10-CM

## 2012-11-19 LAB — COMPREHENSIVE METABOLIC PANEL
Alkaline Phosphatase: 55 U/L (ref 39–117)
BUN: 5 mg/dL — ABNORMAL LOW (ref 6–23)
GFR calc Af Amer: 73 mL/min — ABNORMAL LOW (ref >90)
GFR calc non Af Amer: 63 mL/min — ABNORMAL LOW (ref >90)
Glucose, Bld: 118 mg/dL — ABNORMAL HIGH (ref 70–99)
Potassium: 3.9 mEq/L (ref 3.5–5.1)
Total Bilirubin: 0.2 mg/dL — ABNORMAL LOW (ref 0.3–1.2)
Total Protein: 6.6 g/dL (ref 6.0–8.3)

## 2012-11-19 LAB — CBC
HCT: 33.9 % — ABNORMAL LOW (ref 36.0–46.0)
Hemoglobin: 10.9 g/dL — ABNORMAL LOW (ref 12.0–15.0)
MCHC: 32.2 g/dL (ref 30.0–36.0)

## 2012-11-19 MED ORDER — IOHEXOL 300 MG/ML  SOLN: 25.0000 mL | INTRAMUSCULAR | Status: AC

## 2012-11-19 MED ORDER — DIPHENHYDRAMINE HCL 25 MG PO CAPS: 25.0000 mg | ORAL_CAPSULE | Freq: Every evening | ORAL | Status: DC | PRN

## 2012-11-19 NOTE — Progress Notes (Signed)
Patient ID: [name] [name], female   DOB: Nov 06, 1945, 67 y.o.   MRN: [id number]     Subjective: Pt feeling better today, no vomiting since 2 nights ago.  Still feeling nauseated.  Reports her abdominal pain is improving.  Still having diarrhea, no cultures obtained.  Objective: Vital signs in last 24 hours: Temp:  [97.8 F (36.6 C)-98 F (36.7 C)] 98 F (36.7 C) (05/20 0628) Pulse Rate:  [52-61] 61 (05/20 0628) Resp:  [16-18] 16 (05/20 0628) BP: (122-124)/(60-78) 124/77 mmHg (05/20 0628) SpO2:  [94 %-97 %] 94 % (05/20 0628) Last BM Date: 11/18/12  Intake/Output from previous day: 05/19 0701 - 05/20 0700 In: 540 [P.O.:480; I.V.:60] Out: 2750 [Urine:2750] Intake/Output this shift:    PE:  Gen: Alert, NAD, pleasant  Card: RRR, no M/G/R heard  Pulm: CTA, no W/R/R  Abd: Soft, mild TTP LLQ, /ND, +BS, no HSM,  Ext: No erythema, edema, or tenderness    Lab Results:   Recent Labs  11/19/12 0455  WBC 7.5  HGB 10.9*  HCT 33.9*  PLT 308   BMET  Recent Labs  11/19/12 0455  NA 135  K 3.9  CL 98  CO2 30  GLUCOSE 118*  BUN 5*  CREATININE 0.92  CALCIUM 9.3   PT/INR No results found for this basename: LABPROT, INR,  in the last 72 hours CMP     Component Value Date/Time   NA 135 11/19/2012 0455   K 3.9 11/19/2012 0455   CL 98 11/19/2012 0455   CO2 30 11/19/2012 0455   GLUCOSE 118* 11/19/2012 0455   BUN 5* 11/19/2012 0455   CREATININE 0.92 11/19/2012 0455   CALCIUM 9.3 11/19/2012 0455   PROT 6.6 11/19/2012 0455   ALBUMIN 3.4* 11/19/2012 0455   AST 56* 11/19/2012 0455   ALT 85* 11/19/2012 0455   ALKPHOS 55 11/19/2012 0455   BILITOT 0.2* 11/19/2012 0455   GFRNONAA 63* 11/19/2012 0455   GFRAA 73* 11/19/2012 0455   Lipase     Component Value Date/Time   LIPASE 18 11/17/2012 0452    Anti-infectives: Anti-infectives   Start     Dose/Rate Route Frequency Ordered Stop   11/15/12 0600  ciprofloxacin (CIPRO) IVPB 400 mg     400 mg 200 mL/hr over 60 Minutes Intravenous  Every 12 hours 11/15/12 0459     11/15/12 0600  metroNIDAZOLE (FLAGYL) IVPB 500 mg     500 mg 100 mL/hr over 60 Minutes Intravenous 3 times per day 11/15/12 0459     11/15/12 0000  piperacillin-tazobactam (ZOSYN) IVPB 3.375 g     3.375 g 12.5 mL/hr over 240 Minutes Intravenous  Once 11/14/12 2347 11/15/12 0024       Assessment/Plan Diverticulitis left colon, uncomplicated -repeat CT in AM -obtain stool cultures and C.diff -cipro/flagyl -CBC stable today. -ambulate -PRN zofran -pt will need a colonoscopy in 6 weeks   Transaminitis   -LFTs stable -continue to monitor    LOS: 5 days    Lori Liew ANP-BC 11/19/2012, 7:30 AM  [phone]

## 2012-11-19 NOTE — Progress Notes (Signed)
Agree with A&P of ED,NP. She is stable and main issue is ongoing diarrhea. CT for tomorrow. Her hearing issues make communication a bit difficult, but I think she understands plans

## 2012-11-20 ENCOUNTER — Encounter (HOSPITAL_COMMUNITY): Payer: Self-pay | Admitting: Radiology

## 2012-11-20 ENCOUNTER — Inpatient Hospital Stay (HOSPITAL_COMMUNITY): Payer: Medicare Other

## 2012-11-20 LAB — CLOSTRIDIUM DIFFICILE BY PCR: Toxigenic C. Difficile by PCR: NEGATIVE

## 2012-11-20 LAB — URINALYSIS, ROUTINE W REFLEX MICROSCOPIC
Bilirubin Urine: NEGATIVE
Glucose, UA: NEGATIVE mg/dL
Leukocytes, UA: NEGATIVE
Nitrite: NEGATIVE
Specific Gravity, Urine: 1.008 (ref 1.005–1.030)
pH: 7 (ref 5.0–8.0)

## 2012-11-20 MED ORDER — IOHEXOL 300 MG/ML  SOLN
25.0000 mL | INTRAMUSCULAR | Status: AC
  Administered 2012-11-20 (×2): 25 mL via ORAL

## 2012-11-20 MED ORDER — IOHEXOL 300 MG/ML  SOLN
100.0000 mL | Freq: Once | INTRAMUSCULAR | Status: AC | PRN
  Administered 2012-11-20: 100 mL via INTRAVENOUS

## 2012-11-20 NOTE — Progress Notes (Signed)
Patient has own CPAP machine in room. Patient tolerating well !!

## 2012-11-20 NOTE — Progress Notes (Signed)
[name] [name] [id number] [birth year]   Subjective:  Pt feeling better today, diarrhea has improved.  Complaining of left flank pain and pelvic pain.  Denies further nausea or vomiting. Drinking contrast for ct that is scheduled at 9am. +flatus, bm last night. Objective:  Vital signs:  Filed Vitals:   11/19/12 0628 11/19/12 1400 11/19/12 2215 11/20/12 0615  BP: 124/77 123/62 131/81 119/71  Pulse: 61 63 63 65  Temp: 98 F (36.7 C) 98.1 F (36.7 C) 97.9 F (36.6 C) 97.5 F (36.4 C)  TempSrc: Oral Oral Oral Axillary  Resp: 16 18 18 18   Height:      Weight:      SpO2: 94% 97% 96% 92%    Last BM Date: 11/19/12  Intake/Output   Yesterday:  05/20 0701 - 05/21 0700 In: 3665.8 [P.O.:960; I.V.:605.8; IV Piggyback:2100] Out: 4750 [Urine:4750] This shift:     Bowel function:  Flatus: +  BM: 5/20  Drain: none  Physical Exam:  General: Pt awake/alert/oriented x3 and in no acute distress Psych:  No delerium/psychosis/paranoia HENT: Normocephalic, Mucus membranes moist.  No thrush Neck: Supple, No tracheal deviation Chest: CTA bilaterally.  No chest wall pain w good excursion CV:  S1S2 RRR no murmurs, rubs or gallops.  No edema.  Pulses are intact. MS: Normal AROM mjr joints.  No obvious deformity Abdomen: Soft.  Nondistended. Mildly tender pelvic region +CVA tenderness.  No evidence of peritonitis.  No incarcerated hernias. Ext:  SCDs BLE.  No mjr edema.  No cyanosis Skin: No petechiae / purpura   Problem List:   Active Problems:   Diverticulitis   Transaminitis    Results:   Labs: Results for orders placed during the hospital encounter of 11/14/12 (from the past 48 hour(s))  CBC     Status: Abnormal   Collection Time    11/19/12  4:55 AM      Result Value Range   WBC 7.5  4.0 - 10.5 K/uL   RBC 3.65 (*) 3.87 - 5.11 MIL/uL   Hemoglobin 10.9 (*) 12.0 - 15.0 g/dL   HCT 08.6 (*) 57.8 - 46.9 %   MCV 92.9  78.0 - 100.0 fL   MCH 29.9  26.0 - 34.0 pg   MCHC  32.2  30.0 - 36.0 g/dL   RDW 62.9  52.8 - 41.3 %   Platelets 308  150 - 400 K/uL  COMPREHENSIVE METABOLIC PANEL     Status: Abnormal   Collection Time    11/19/12  4:55 AM      Result Value Range   Sodium 135  135 - 145 mEq/L   Potassium 3.9  3.5 - 5.1 mEq/L   Chloride 98  96 - 112 mEq/L   CO2 30  19 - 32 mEq/L   Glucose, Bld 118 (*) 70 - 99 mg/dL   BUN 5 (*) 6 - 23 mg/dL   Creatinine, Ser 2.44  0.50 - 1.10 mg/dL   Calcium 9.3  8.4 - 01.0 mg/dL   Total Protein 6.6  6.0 - 8.3 g/dL   Albumin 3.4 (*) 3.5 - 5.2 g/dL   AST 56 (*) 0 - 37 U/L   ALT 85 (*) 0 - 35 U/L   Alkaline Phosphatase 55  39 - 117 U/L   Total Bilirubin 0.2 (*) 0.3 - 1.2 mg/dL   GFR calc non Af Amer 63 (*) >90 mL/min   GFR calc Af Amer 73 (*) >90 mL/min   Comment:  The eGFR has been calculated     using the CKD EPI equation.     This calculation has not been     validated in all clinical     situations.     eGFR's persistently     <90 mL/min signify     possible Chronic Kidney Disease.    Imaging / Studies: No results found.  Medications / Allergies: per chart  Antibiotics: Anti-infectives   Start     Dose/Rate Route Frequency Ordered Stop   11/15/12 0600  ciprofloxacin (CIPRO) IVPB 400 mg     400 mg 200 mL/hr over 60 Minutes Intravenous Every 12 hours 11/15/12 0459     11/15/12 0600  metroNIDAZOLE (FLAGYL) IVPB 500 mg     500 mg 100 mL/hr over 60 Minutes Intravenous 3 times per day 11/15/12 0459     11/15/12 0000  piperacillin-tazobactam (ZOSYN) IVPB 3.375 g     3.375 g 12.5 mL/hr over 240 Minutes Intravenous  Once 11/14/12 2347 11/15/12 0024      Assessment/Plan Diverticulitis left colon, uncomplicated  -CT scheduled today. -Advance diet as tolerated. -IV cipro/flagyl  -encourage ambulation, SCDs, heparin C.diff is pending -She will need a colonoscopy in 6 weeks.   Ashok Norris, [facility] Surgery Pager [phone] Office [phone]  11/20/2012 8:27  AM

## 2012-11-20 NOTE — Progress Notes (Signed)
Agree with A&P of ER,NP. Her CT reviewed and shows improvement. Believe we will be able to discharge tomorrow and follow up in office in two weeks

## 2012-11-21 ENCOUNTER — Telehealth (INDEPENDENT_AMBULATORY_CARE_PROVIDER_SITE_OTHER): Payer: Self-pay

## 2012-11-21 ENCOUNTER — Telehealth (INDEPENDENT_AMBULATORY_CARE_PROVIDER_SITE_OTHER): Payer: Self-pay | Admitting: General Surgery

## 2012-11-21 MED ORDER — FLUCONAZOLE 200 MG PO TABS: 200.0000 mg | ORAL_TABLET | Freq: Every day | ORAL | Status: DC

## 2012-11-21 MED ORDER — NICOTINE 21 MG/24HR TD PT24: 1.0000 | MEDICATED_PATCH | TRANSDERMAL | Status: DC

## 2012-11-21 MED ORDER — HYDROCODONE-ACETAMINOPHEN 5-325 MG PO TABS: 1.0000 | ORAL_TABLET | Freq: Four times a day (QID) | ORAL | Status: DC | PRN

## 2012-11-21 MED ORDER — CIPROFLOXACIN HCL 500 MG PO TABS: 500.0000 mg | ORAL_TABLET | Freq: Two times a day (BID) | ORAL | Status: AC

## 2012-11-21 MED ORDER — PANTOPRAZOLE SODIUM 40 MG PO TBEC: 40.0000 mg | DELAYED_RELEASE_TABLET | Freq: Every day | ORAL | Status: AC

## 2012-11-21 MED ORDER — METRONIDAZOLE 500 MG PO TABS: 500.0000 mg | ORAL_TABLET | Freq: Three times a day (TID) | ORAL | Status: AC

## 2012-11-21 NOTE — Progress Notes (Signed)
Pt for d/c home today. IV d/c'd. Tolerates diet. Low fiber diet list provided for pt. For home use. Discharge instructions & RX given with verbalized understanding. Call placed to MD/PA regarding pt. wanting med for reflux. Pt ambulated in room w/o problem. No noted loose stools so far.

## 2012-11-21 NOTE — Telephone Encounter (Signed)
Pt requesting nicotine patch, sent to pharmacy. Further rx to come from pcp

## 2012-11-21 NOTE — Plan of Care (Signed)
Problem: Food- and Nutrition-Related Knowledge Deficit (NB-1.1) Goal: Nutrition education Formal process to instruct or train a patient/client in a skill or to impart knowledge to help patients/clients voluntarily manage or modify food choices and eating behavior to maintain or improve health. Outcome: Completed/Met Date Met:  11/21/12 Discussed diet therapy for diverticulitis/diverticulosis. Described the relationship between fiber and each condition. Discussed sources of fiber in the diet and when to avoid fiber/add fiber in. Encouraged adequate fluid intake, especially when having diarrhea and when fiber intake is increased. Meal plans discussed. Teach back method used. Pt expressed understanding. Expect good compliance. Handouts provided with RD contact information.

## 2012-11-21 NOTE — Discharge Summary (Signed)
Physician Discharge Summary  [name] [name] QMV:[id number] DOB: 27-Jul-1945 DOA: 11/14/2012   Consultation: Dietician  Admit date: 11/14/2012 Discharge date: 11/21/2012  Recommendations for Outpatient Follow-up:    Follow-up Information   Follow up with [facility], MD In 2 weeks. (our office will contact you to schedule a 2 week follow up with our surgeon.)    Contact information:   [address] [place] [address] [phone]      Discharge Diagnoses:  1. Acute uncomplicated diverticulitis   Surgical Procedure: none  Discharge Condition: stable Disposition: home  Diet recommendation: high fiber  Filed Weights   11/14/12 2104  Weight: 175 lb (79.379 kg)       Hospital Course:  [name] [name] is a 67 year old female with a history of depression, tobacco use, HOH, OSA and allergic rhinitis.  She was admitted to [facility] due to abdominal pain and diarrhea.  She was found to have acute diverticulitis and elevated lipase on laboratory evaluation.  A follow up ultrasound did not show any gallstones.  Her lipase was followed and normalized.  She was treated with cipro/flagyl for the diverticulitis.  A follow up CT showed an improvement in diverticulitis, no abscess or fluid collection.  She has diarrhea, a c diff was negative.  This improved and was likely due to the diverticulitis.  The patient also had nausea, this resolved without further intervention.  We would recommend GI evaluation should her symptoms persist.  Today, she was eating a regular diet, voiding, ambulating.  Her VSS.  She was felt stable for discharge.  She was given an rx for an additional week of antibiotic therapy which will total 2 weeks.  She was given pain medication as well and fluconazole for yeast infection.  We discussed side effects of atbx therapy and encouraged to take otc probiotics or yogurt to help build good gut flora.  She was also seen by a dietician to help with  diet at home.  Our office will schedule a follow up in 2 weeks.  She was encouraged to call sooner should she have any new or worsening symptoms.  Lastly, I strongly encouraged the patient to stop smoking.   She will need an outpatient colonoscopy in 6 weeks.  Physical Exam: General appearance: alert and oriented. Calm and cooperative. No acute distress. VSS. Afebrile.  Resp: clear to auscultation bilaterally  Cardio: S1S1 RRR without murmurs or gallops. No edema. GI: soft round and nontender. +BS x4 quadrants. No organomegaly, hernias or masses.  Pulses: +2 bilateral distal pulses without cyanosis  Neurologic: Mental status: Alert, oriented, thought content appropriate   Discharge Instructions     Medication List    STOP taking these medications       loratadine 10 MG tablet  Commonly known as:  CLARITIN      TAKE these medications       albuterol 108 (90 BASE) MCG/ACT inhaler  Commonly known as:  PROVENTIL HFA;VENTOLIN HFA  Inhale 2 puffs into the lungs every 6 (six) hours as needed for wheezing.     alendronate 70 MG tablet  Commonly known as:  FOSAMAX  Take 70 mg by mouth every 7 (seven) days. Take with a full glass of water on an empty stomach on Sundays     aspirin 81 MG tablet  Take 81 mg by mouth every morning. Take an additional tablet if needed for leg cramps in the evening     Bilberry 1000 MG Caps  Take 1 tablet by mouth every morning.     budesonide-formoterol 160-4.5 MCG/ACT inhaler  Commonly known as:  SYMBICORT  Inhale 2 puffs into the lungs daily as needed (for wheezing).     buPROPion 150 MG 12 hr tablet  Commonly known as:  WELLBUTRIN SR  Take 150 mg by mouth 2 (two) times daily.     cetirizine 10 MG tablet  Commonly known as:  ZYRTEC  Take 10 mg by mouth every morning.     cholecalciferol 1000 UNITS tablet  Commonly known as:  VITAMIN D  Take 1,000 Units by mouth every morning.     CHROMIUM PICOLINATE PO  Take 1 tablet by mouth every morning.      ciprofloxacin 500 MG tablet  Commonly known as:  CIPRO  Take 1 tablet (500 mg total) by mouth 2 (two) times daily.     citalopram 40 MG tablet  Commonly known as:  CELEXA  Take 40 mg by mouth every morning.     cyclobenzaprine 10 MG tablet  Commonly known as:  FLEXERIL  Take 10 mg by mouth 2 (two) times daily as needed for muscle spasms.     fluconazole 200 MG tablet  Commonly known as:  DIFLUCAN  Take 1 tablet (200 mg total) by mouth daily.     HYDROcodone-acetaminophen 5-325 MG per tablet  Commonly known as:  NORCO/VICODIN  Take 1 tablet by mouth every 6 (six) hours as needed.     Magnesium 500 MG Tabs  Take 1 tablet by mouth every morning.     metroNIDAZOLE 500 MG tablet  Commonly known as:  FLAGYL  Take 1 tablet (500 mg total) by mouth 3 (three) times daily.     multivitamin with minerals Tabs  Take 1 tablet by mouth every morning.     sodium chloride 2 % ophthalmic solution  Commonly known as:  MURO 128  Place 1 drop into both eyes every morning.     traMADol 50 MG tablet  Commonly known as:  ULTRAM  Take 50 mg by mouth every 8 (eight) hours as needed for pain.     vitamin B-12 1000 MCG tablet  Commonly known as:  CYANOCOBALAMIN  Take 1,000 mcg by mouth every morning.           Follow-up Information   Follow up with [facility]MAN,DAVID H, MD In 2 weeks. (our office will contact you to schedule a 2 week follow up with our surgeon.)    Contact information:   [address] [place] [address] [phone]        The results of significant diagnostics from this hospitalization (including imaging, microbiology, ancillary and laboratory) are listed below for reference.    Significant Diagnostic Studies: US Abdomen Complete  11/15/2012   *RADIOLOGY REPORT*  Clinical Data:  Abdominal pain.  COMPLETE ABDOMINAL ULTRASOUND  Comparison:  CT scan dated 05/15 1014  Findings:  Gallbladder:  No gallstones, gallbladder wall thickening, or pericholecystic  fluid. Negative sonographic Murphy's sign.  Common bile duct:  Normal.  5 mm in diameter.  Liver:  Slight increased echogenicity of the liver parenchyma consistent with slight hepatic steatosis.  No focal lesions.  IVC:  The intrahepatic portion is normal.  The infrahepatic portion is not visible but is normal on the CT scan of 11/14/2012.  Pancreas:  Normal.  Spleen:  Normal.  0.6 cm in length.  Right Kidney:  Normal.  11.3 cm in length.  Left Kidney:  Normal.  12.0 cm in length.  Abdominal aorta:  Normal.  2.4 cm in diameter.  IMPRESSION: Slight hepatic steatosis.  Normal gallbladder.   Original Report Authenticated By: Francene Boyers, M.D.   Ct Abdomen Pelvis W Contrast  11/20/2012   *RADIOLOGY REPORT*  Clinical Data: Follow-up diverticulitis.  Left lower quadrant abdominal pain, diarrhea.  CT ABDOMEN AND PELVIS WITH CONTRAST  Technique:  Multidetector CT imaging of the abdomen and pelvis was performed following the standard protocol during bolus administration of intravenous contrast.  Contrast: OMNIPAQUE IOHEXOL 300 MG/ML  SOLN  Comparison: 11/14/2012  Findings: Lung bases are clear.  Liver, spleen, pancreas, and adrenal glands are within normal limits.  Suspected fundal gallbladder adenomyomatosis (series 2/image 60).  Kidneys are within normal limits.  No hydronephrosis.  No evidence of bowel obstruction.  Diverticulosis with mild diverticulitis involving the proximal sigmoid colon (series 2/image 57), improved.  No drainable fluid collection or abscess.  No free air to suggest macroscopic perforation.  Atherosclerotic calcifications of the abdominal aorta and branch vessels.  No abdominopelvic ascites.  No suspicious abdominopelvic lymphadenopathy.  Uterus and bilateral ovaries are unremarkable.  Bladder is mildly thick-walled.  Degenerative changes of the visualized thoracolumbar spine.  IMPRESSION: Mild proximal sigmoid diverticulitis, improved.  No drainable fluid collection or abscess.  No free  air to suggest macroscopic perforation.   Original Report Authenticated By: Charline Bills, M.D.   Ct Abdomen Pelvis W Contrast  11/14/2012   *RADIOLOGY REPORT*  Clinical Data: Left-sided abdominal pain.  CT ABDOMEN AND PELVIS WITH CONTRAST  Technique:  Multidetector CT imaging of the abdomen and pelvis was performed following the standard protocol during bolus administration of intravenous contrast.  Contrast: 50mL OMNIPAQUE IOHEXOL 300 MG/ML  SOLN, OMNIPAQUE IOHEXOL 300 MG/ML  SOLN  Comparison: None.  Findings: Coronary artery calcifications. Lung bases are clear.  No effusions.  Heart is normal size.  Mild diffuse fatty infiltration of the liver.  Area of focal wall thickening within the gallbladder fundus may reflect area of focal adenomyomatosis.  No focal lesion in the liver.  Spleen, pancreas, adrenals, kidneys are normal.  Descending colonic and sigmoid diverticulosis.  Inflammatory change around the distal descending colon compatible with active diverticulitis.  Uterus, adnexa urinary bladder are normal.  Small bowel is decompressed.  Aorta and iliac vessels are heavily calcified, non-aneurysmal. No acute bony abnormality.  Degenerative changes in the lower lumbar spine.  IMPRESSION: Descending colonic and sigmoid diverticulosis.  Distal descending colonic diverticulitis.   Original Report Authenticated By: Charlett Nose, M.D.    Microbiology: Recent Results (from the past 240 hour(s))  CLOSTRIDIUM DIFFICILE BY PCR     Status: None   Collection Time    11/19/12  8:27 PM      Result Value Range Status   C difficile by pcr NEGATIVE  NEGATIVE Final     Labs: Basic Metabolic Panel:  Recent Labs Lab 11/14/12 2127 11/19/12 0455  NA 138 135  K 3.7 3.9  CL 97 98  CO2 31 30  GLUCOSE 133* 118*  BUN 9 5*  CREATININE 0.80 0.92  CALCIUM 9.4 9.3   Liver Function Tests:  Recent Labs Lab 11/14/12 2127 11/15/12 0805 11/17/12 0452 11/19/12 0455  AST 351* 116* 28 56*  ALT 260*  180* 84* 85*  ALKPHOS 71 66 51 55  BILITOT 0.7 0.6 0.3 0.2*  PROT 7.2 7.1 6.2 6.6  ALBUMIN 3.6 3.5 3.2* 3.4*    Recent Labs Lab 11/14/12 2127 11/15/12 0630 11/17/12 4098  LIPASE 2023* 90* 18   No results found for this basename: AMMONIA,  in the last 168 hours CBC:  Recent Labs Lab 11/14/12 2127 11/15/12 0630 11/19/12 0455  WBC 12.1* 10.2 7.5  NEUTROABS 8.5* 7.1  --   HGB 13.6 12.5 10.9*  HCT 39.1 36.8 33.9*  MCV 90.9 89.5 92.9  PLT 295 268 308    Active Problems:   Diverticulitis   Transaminitis   Time coordinating discharge: 30 mins  Signed:  Althia Egolf, ANP-BC

## 2012-11-21 NOTE — Telephone Encounter (Signed)
V/M to call to schedule a follow up appointment with Dr. Ezzard Standing in 4 wks advised to ask for [facility]

## 2012-11-24 LAB — STOOL CULTURE

## 2012-12-02 ENCOUNTER — Telehealth (INDEPENDENT_AMBULATORY_CARE_PROVIDER_SITE_OTHER): Payer: Self-pay | Admitting: General Surgery

## 2012-12-02 NOTE — Telephone Encounter (Signed)
Dr. Ezzard Standing will inform me of what day to schedule her  the week of 9-13 which is LDOW week for him  thanksGK

## 2012-12-02 NOTE — Telephone Encounter (Signed)
Message copied by Liliana Cline on Mon Dec 02, 2012  1:20 PM ------      Message from: Zacarias Pontes      Created: Mon Dec 02, 2012 12:56 PM       Not sure if I sent this to you or not....pt needs follow up apt w/MW for diverticulitis....thanks ------

## 2012-12-02 NOTE — Telephone Encounter (Signed)
According to notes it looks like patient needs to follow up with Dr Ezzard Standing. Glenda please make appt.

## 2012-12-03 ENCOUNTER — Telehealth (INDEPENDENT_AMBULATORY_CARE_PROVIDER_SITE_OTHER): Payer: Self-pay

## 2012-12-03 NOTE — Telephone Encounter (Signed)
Patient calling into office regarding her follow up appointment.  Patient requested to speak with Tresa Endo directly about an earlier appointment.  I advised patient that I can make, reschedule appointments as needed or directed per Physician or DC instructions. Per documentation from 12/02/12,  Patient will be seen by Dr. Ezzard Standing during the week of June 9-13th per Rivka Barbara, LPN for Dr. Ezzard Standing.  Patient aware that we will call her with appointment date & time once determined by Dr. Ezzard Standing. (s/p Diverticulitis)

## 2012-12-10 ENCOUNTER — Telehealth (INDEPENDENT_AMBULATORY_CARE_PROVIDER_SITE_OTHER): Payer: Self-pay

## 2012-12-10 NOTE — Telephone Encounter (Signed)
Patient states she is having a great deal of pain abdomen rates 5 and back rates 6Patient scheduled with DR. Toth in [place] 12/11/12 pt aware

## 2012-12-11 ENCOUNTER — Ambulatory Visit (INDEPENDENT_AMBULATORY_CARE_PROVIDER_SITE_OTHER): Payer: Medicare Other | Admitting: General Surgery

## 2012-12-11 ENCOUNTER — Encounter (INDEPENDENT_AMBULATORY_CARE_PROVIDER_SITE_OTHER): Payer: Self-pay | Admitting: General Surgery

## 2012-12-11 VITALS — BP 130/82 | HR 80 | Temp 98.1°F | Resp 18 | Ht 62.0 in | Wt 185.2 lb

## 2012-12-11 DIAGNOSIS — K5732 Diverticulitis of large intestine without perforation or abscess without bleeding: Secondary | ICD-10-CM

## 2012-12-11 DIAGNOSIS — K5792 Diverticulitis of intestine, part unspecified, without perforation or abscess without bleeding: Secondary | ICD-10-CM

## 2012-12-11 MED ORDER — FLUCONAZOLE 200 MG PO TABS: 200.0000 mg | ORAL_TABLET | Freq: Every day | ORAL | Status: DC

## 2012-12-11 NOTE — Addendum Note (Signed)
Addended byLiliana Cline on: 12/11/2012 03:49 PM   Modules accepted: Orders

## 2012-12-11 NOTE — Progress Notes (Signed)
Subjective:     Patient ID: [name] [name], female   DOB: 28-Dec-1945, 67 y.o.   MRN: [id number]  HPI The patient is a 67 year old white female who was hospitalized around [facility] with an episode of sigmoid diverticulitis. She apparently responded well to antibiotics. She stayed in the hospital for 5 or 6 days and was then discharged. Since returning home she has continued to have 5-6 loose bowel movements a day. She continues to have some left lower quadrant and back pain. She denies any fevers or chills.  Review of Systems  Constitutional: Negative.   HENT: Negative.   Eyes: Negative.   Respiratory: Negative.   Cardiovascular: Negative.   Gastrointestinal: Positive for abdominal pain and diarrhea.  Endocrine: Negative.   Genitourinary: Negative.   Musculoskeletal: Negative.   Skin: Negative.   Allergic/Immunologic: Negative.   Neurological: Negative.   Hematological: Negative.   Psychiatric/Behavioral: Negative.        Objective:   Physical Exam  Constitutional: She is oriented to person, place, and time. She appears well-developed and well-nourished.  HENT:  Head: Normocephalic and atraumatic.  Eyes: Conjunctivae and EOM are normal. Pupils are equal, round, and reactive to light.  Neck: Normal range of motion. Neck supple.  Cardiovascular: Normal rate, regular rhythm and normal heart sounds.   Pulmonary/Chest: Effort normal and breath sounds normal.  Abdominal: Soft. Bowel sounds are normal.  She complains of mild diffuse abdominal pain. She has some moderate left lower quadrant tenderness. There is no guarding or peritonitis. There is no palpable mass. She also has some tenderness along her left flank and back area.  Musculoskeletal: Normal range of motion.  Neurological: She is alert and oriented to person, place, and time.  Skin: Skin is warm and dry.  Psychiatric: She has a normal mood and affect. Her behavior is normal.       Assessment:     The patient was  recently hospitalized with diverticulitis and continues to have some left lower quadrant and back pain.     Plan:     Because of this I think she should be reevaluated with a CT scan of her abdomen and pelvis as well as sending her stool for C. Difficile toxin. We will have her followup with Dr. Ezzard Standing next week ago and the results of the studies.

## 2012-12-12 ENCOUNTER — Telehealth (INDEPENDENT_AMBULATORY_CARE_PROVIDER_SITE_OTHER): Payer: Self-pay

## 2012-12-12 LAB — CBC WITH DIFFERENTIAL/PLATELET
Lymphocytes Relative: 38 % (ref 12–46)
Lymphs Abs: 3.1 10*3/uL (ref 0.7–4.0)
Neutrophils Relative %: 47 % (ref 43–77)
Platelets: 377 10*3/uL (ref 150–400)
RBC: 4.68 MIL/uL (ref 3.87–5.11)
WBC: 8.1 10*3/uL (ref 4.0–10.5)

## 2012-12-12 LAB — COMPREHENSIVE METABOLIC PANEL
ALT: 31 U/L (ref 0–35)
CO2: 30 mEq/L (ref 19–32)
Calcium: 9.6 mg/dL (ref 8.4–10.5)
Chloride: 101 mEq/L (ref 96–112)
Potassium: 4.4 mEq/L (ref 3.5–5.3)
Sodium: 137 mEq/L (ref 135–145)
Total Protein: 7.3 g/dL (ref 6.0–8.3)

## 2012-12-12 NOTE — Telephone Encounter (Signed)
V/M Protonix 40mg  1po qd # 30/ one refill [facility] [place]

## 2012-12-13 ENCOUNTER — Ambulatory Visit (INDEPENDENT_AMBULATORY_CARE_PROVIDER_SITE_OTHER): Payer: Medicare Other | Admitting: General Surgery

## 2012-12-13 ENCOUNTER — Telehealth (INDEPENDENT_AMBULATORY_CARE_PROVIDER_SITE_OTHER): Payer: Self-pay

## 2012-12-13 NOTE — Telephone Encounter (Signed)
Pt called wanting to r/s her ct date. Pt wants Ct done prior to ov with Dr Carolynne Edouard.  Pt given # for [facility] to adjust her ct date.

## 2012-12-16 ENCOUNTER — Ambulatory Visit
Admission: RE | Admit: 2012-12-16 | Discharge: 2012-12-16 | Disposition: A | Discharge status: Home / Self Care | Payer: Medicare Other | Source: Ambulatory Visit | Attending: General Surgery | Admitting: General Surgery

## 2012-12-16 DIAGNOSIS — K5792 Diverticulitis of intestine, part unspecified, without perforation or abscess without bleeding: Secondary | ICD-10-CM

## 2012-12-16 MED ORDER — IOHEXOL 300 MG/ML  SOLN
125.0000 mL | Freq: Once | INTRAMUSCULAR | Status: AC | PRN
  Administered 2012-12-16: 125 mL via INTRAVENOUS

## 2012-12-17 ENCOUNTER — Encounter (INDEPENDENT_AMBULATORY_CARE_PROVIDER_SITE_OTHER): Payer: Self-pay | Admitting: Surgery

## 2012-12-17 ENCOUNTER — Ambulatory Visit (INDEPENDENT_AMBULATORY_CARE_PROVIDER_SITE_OTHER): Payer: Medicare Other | Admitting: Surgery

## 2012-12-17 VITALS — BP 130/78 | HR 68 | Temp 98.0°F | Resp 16 | Ht 62.0 in | Wt 185.6 lb

## 2012-12-17 DIAGNOSIS — K5732 Diverticulitis of large intestine without perforation or abscess without bleeding: Secondary | ICD-10-CM

## 2012-12-17 DIAGNOSIS — K5792 Diverticulitis of intestine, part unspecified, without perforation or abscess without bleeding: Secondary | ICD-10-CM

## 2012-12-17 NOTE — Progress Notes (Addendum)
Re: [name] [name]  DOB: [birth year]  MRN: [id number]  Urgent Office  ASSESSMENT AND PLAN:  1. Colonic diverticulitis at the junction of the left colon and sigmoid colon   Hospitalized 11/14/2012 - 11/21/2012  Repeat CT scan showed resolution of diverticulitis.  She can return to high fiber diet.  She needs to get a colonoscopy about 3 to 4 months after the diverticulitis.  She's never had a colonoscopy.  She'll see me back on a prn basis.  2.  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  Copy of CT scan report given to patient.  She will check with Dr. Marya Landry about follow up. 3. Elevated lipase   [place] neg. - Resolved in the hospital.  Cause unknown. 4. Smokes cigarettes   She knows that she needs to quit this 5. Sleep apnea   On CPAP x 1 year   Sees Dr. William Hamburger in [place]  6. Decreased hearing - sees Dr. Verdie Drown, ENT and Geralyn Corwin, [facility]  Particularly in left ear  7. Occasional neck pain   Old surgery - 1984  8. History of depression   Chief Complaint   Patient presents with   .  Abdominal Pain   REFERRING PHYSICIAN: Dr. Gwyneth Sprout, MD, [facility]   HISTORY OF PRESENT ILLNESS:  [name] [name] is a 67 y.o. (DOB: 1946-03-07) white female whose primary care physician is  Verl Bangs, MD  [facility]) and was hospitalized at [facility] hospital from 11/14/2012 - 11/21/2012 for diverticulitis.  She was discharged doing better, but saw Dr. Carolynne Edouard last week with continued LLQ and back pain.  He obtained a CBC and repeat CT scan of her abdomen.  She is here to review her history and review her CT scan results.  She is doing better now, but is having 5 to 6 BM a day.  The stools are soft, no loose.  She has had no nausea, no vomiting, no fever.  History of Diverticulitis: She gives a somewhat wandering history of illness, this may be in part that her poor hearing makes her miss some of my questions.  The patient has had  "indigestion" for 4 to 6 months. Her symptoms have been helped with antacids. Then Thursday, 11/14/2012, she developed LLQ that radiated to her back. She had nausea and vomited. Then as this resolved she had multiple loose stools. She has not prior GI history. She has never had a colonoscopy. She went to [facility]. Dr. Anitra Lauth thought that Ms. Vonita Moss had pancreatitis and diverticulitis and sent her to [facility] ER for me to evaluate.   Her mother had both gall bladder disease and diverticular disease - though she was a little non- specific about her mother's Diverticular disease.  Her initial labs in the [facility] ER in mid May were: Lipase - 2023 - 11/14/2012  WBC - 12,100 - 11/14/2012  LFT's - AST - 351, ALT - 260 - 11/14/2012  CT - Diverticulitis at the junction of left colon/sigmoid colon - 11/14/2012   Past Medical History   Diagnosis  Date   .  Sinus drainage    .  Deviated septum    .  Hearing loss    .  Depression     Past Surgical History   Procedure  Laterality  Date   .  Appendectomy     .  Breast surgery     .  Neck surgery     .  Back surgery     .  Cesarean section     .  Eye surgery     .  Foot surgery      Current Facility-Administered Medications   Medication  Dose  Route  Frequency  Provider  Last Rate  Last Dose   .  HYDROmorphone (DILAUDID) injection 0.5 mg  0.5 mg  Intravenous  Once  John-Adam Bonk, MD     .  lactated ringers infusion   Intravenous  Continuous  John-Adam Bonk, MD     .  ondansetron (ZOFRAN) injection 4 mg  4 mg  Intravenous  Once  Jones Skene, MD      Current Outpatient Prescriptions   Medication  Sig  Dispense  Refill   .  alendronate (FOSAMAX) 70 MG tablet  Take 70 mg by mouth every 7 (seven) days. Take with a full glass of water on an empty stomach on Sundays     .  aspirin 81 MG tablet  Take 81 mg by mouth every morning. Take an additional tablet if needed for leg cramps in the evening     .  Bilberry 1000 MG CAPS  Take 1 tablet by mouth  every morning.     Marland Kitchen  buPROPion (WELLBUTRIN SR) 150 MG 12 hr tablet  Take 150 mg by mouth 2 (two) times daily.     .  cetirizine (ZYRTEC) 10 MG tablet  Take 10 mg by mouth every morning.     .  cholecalciferol (VITAMIN D) 1000 UNITS tablet  Take 1,000 Units by mouth every morning.     .  CHROMIUM PICOLINATE PO  Take 1 tablet by mouth every morning.     .  citalopram (CELEXA) 40 MG tablet  Take 40 mg by mouth every morning.     .  cyclobenzaprine (FLEXERIL) 10 MG tablet  Take 10 mg by mouth 2 (two) times daily as needed for muscle spasms.     Marland Kitchen  loratadine (CLARITIN) 10 MG tablet  Take 10 mg by mouth every morning.     .  Magnesium 500 MG TABS  Take 1 tablet by mouth every morning.     .  Multiple Vitamin (MULTIVITAMIN WITH MINERALS) TABS  Take 1 tablet by mouth every morning.     .  sodium chloride (MURO 128) 2 % ophthalmic solution  Place 1 drop into both eyes every morning.     .  traMADol (ULTRAM) 50 MG tablet  Take 50 mg by mouth every 8 (eight) hours as needed for pain.      .  vitamin B-12 (CYANOCOBALAMIN) 1000 MCG tablet  Take 1,000 mcg by mouth every morning.      Allergies   Allergen  Reactions   .  Tape  Hives     Pt can tolerate paper tape    REVIEW OF SYSTEMS:  Skin: No history of rash. No history of abnormal moles.  Infection: No history of hepatitis or HIV. No history of MRSA.  Neurologic: No history of stroke. No history of seizure. Loss of hearing in the left ear and poor hearing in the right.  Cardiac: No history of hypertension. No history of heart disease. No history of prior cardiac catheterization. No history of seeing a cardiologist.  Pulmonary: Smokes 1/2 ppd. On CPAP at night for OSA.  Endocrine: No diabetes. No thyroid disease.  Gastrointestinal: No history of stomach disease. No history of liver disease. No history of gall bladder disease. No history  of pancreas disease. No history of colon disease.  Urologic: No history of kidney stones. No history of bladder  infections.  Musculoskeletal: Neck surgery around 1984. She gets neck pain with anxiety.  Hematologic: No bleeding disorder. No history of anemia. Not anticoagulated.  Psycho-social: The patient is oriented. She has a rambling history and I am not sure how well she follows my questions. It is possible it is just her poor hearing.   SOCIAL and FAMILY HISTORY:  Divorced.  Works as Ship broker.  One child - son, lives in [place].  "Adopted" son - Leeroy Cha, lives in [place], took her to the ER    PHYSICAL EXAM:  BP 130/78  Pulse 68  Temp(Src) 98 F (36.7 C) (Temporal)  Resp 16  Ht 5\' 2"  (1.575 m)  Wt 185 lb 9.6 oz (84.188 kg)  BMI 33.94 kg/m2  General: Older WF who is alert. She has a somewhat rambling history. She is hard of hearing, which makes having a discussion with her difficult.  HEENT: Normal. Pupils equal.  Lymph Nodes: No supraclavicular or cervical nodes.  Lungs: Clear to auscultation and symmetric breath sounds.  Heart: RRR. No murmur or rub.  Abdomen: Soft. No mass. No hernia. Normal bowel sounds. Midline lower abdominal incision, from C section. RLQ scar from appendectomy. She really has almost no tenderness on my exam, though she points to an area in the LLQ.  DATA REVIEWED:  X-ray reports and Epic notes.   Copies of CT scans and labs were given to the patient.  Ovidio Kin, MD, [facility]  [address]  [place], [place] [address]  Phone: [phone] FAX: [phone]

## 2012-12-18 ENCOUNTER — Other Ambulatory Visit: Payer: Medicare Other

## 2013-01-20 DIAGNOSIS — IMO0002 Reserved for concepts with insufficient information to code with codable children: Secondary | ICD-10-CM | POA: Insufficient documentation

## 2013-01-20 DIAGNOSIS — H9190 Unspecified hearing loss, unspecified ear: Secondary | ICD-10-CM | POA: Insufficient documentation

## 2013-01-24 ENCOUNTER — Emergency Department (HOSPITAL_BASED_OUTPATIENT_CLINIC_OR_DEPARTMENT_OTHER): Payer: No Typology Code available for payment source

## 2013-01-24 ENCOUNTER — Emergency Department (HOSPITAL_BASED_OUTPATIENT_CLINIC_OR_DEPARTMENT_OTHER)
Admission: EM | Admit: 2013-01-24 | Discharge: 2013-01-24 | Disposition: A | Discharge status: Home / Self Care | Payer: No Typology Code available for payment source | Attending: Emergency Medicine | Admitting: Emergency Medicine

## 2013-01-24 ENCOUNTER — Encounter (HOSPITAL_BASED_OUTPATIENT_CLINIC_OR_DEPARTMENT_OTHER): Payer: Self-pay

## 2013-01-24 DIAGNOSIS — Z79899 Other long term (current) drug therapy: Secondary | ICD-10-CM | POA: Insufficient documentation

## 2013-01-24 DIAGNOSIS — Z9889 Other specified postprocedural states: Secondary | ICD-10-CM | POA: Insufficient documentation

## 2013-01-24 DIAGNOSIS — Y92410 Unspecified street and highway as the place of occurrence of the external cause: Secondary | ICD-10-CM | POA: Diagnosis not present

## 2013-01-24 DIAGNOSIS — F3289 Other specified depressive episodes: Secondary | ICD-10-CM | POA: Insufficient documentation

## 2013-01-24 DIAGNOSIS — Y9389 Activity, other specified: Secondary | ICD-10-CM | POA: Diagnosis not present

## 2013-01-24 DIAGNOSIS — F17200 Nicotine dependence, unspecified, uncomplicated: Secondary | ICD-10-CM | POA: Diagnosis not present

## 2013-01-24 DIAGNOSIS — S139XXA Sprain of joints and ligaments of unspecified parts of neck, initial encounter: Secondary | ICD-10-CM | POA: Insufficient documentation

## 2013-01-24 DIAGNOSIS — H9190 Unspecified hearing loss, unspecified ear: Secondary | ICD-10-CM | POA: Diagnosis not present

## 2013-01-24 DIAGNOSIS — Z87898 Personal history of other specified conditions: Secondary | ICD-10-CM | POA: Insufficient documentation

## 2013-01-24 DIAGNOSIS — S161XXA Strain of muscle, fascia and tendon at neck level, initial encounter: Secondary | ICD-10-CM

## 2013-01-24 DIAGNOSIS — Z8709 Personal history of other diseases of the respiratory system: Secondary | ICD-10-CM | POA: Insufficient documentation

## 2013-01-24 DIAGNOSIS — M25512 Pain in left shoulder: Secondary | ICD-10-CM

## 2013-01-24 DIAGNOSIS — Z7982 Long term (current) use of aspirin: Secondary | ICD-10-CM | POA: Insufficient documentation

## 2013-01-24 DIAGNOSIS — Z8719 Personal history of other diseases of the digestive system: Secondary | ICD-10-CM | POA: Insufficient documentation

## 2013-01-24 DIAGNOSIS — S199XXA Unspecified injury of neck, initial encounter: Secondary | ICD-10-CM | POA: Diagnosis present

## 2013-01-24 DIAGNOSIS — F329 Major depressive disorder, single episode, unspecified: Secondary | ICD-10-CM | POA: Insufficient documentation

## 2013-01-24 MED ORDER — TRAMADOL HCL 50 MG PO TABS
50.0000 mg | ORAL_TABLET | Freq: Once | ORAL | Status: AC
  Administered 2013-01-24: 50 mg via ORAL
  Filled 2013-01-24: qty 1

## 2013-01-24 NOTE — ED Notes (Signed)
Pt cont talking on room phone, "please don't make me hang up, I'll never get through again!"
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
## 2013-01-24 NOTE — ED Notes (Signed)
[employer] on phone for patient. Phone call transferred into patient's room. Patient remains in a hospital gown.

## 2013-01-24 NOTE — ED Notes (Signed)
Pt talking on phone to her insurance company. Pt states that they will be closed if she hangs up and calls back. Pt requests a few more minutes in room to finish her call with [employer].

## 2013-01-24 NOTE — ED Notes (Addendum)
Upon entering the room, the patient was found to be talking on the phone, the patient was asked multiple times to please end her conversation as we had patients waiting for this room.  At this time the patient voiced understanding stating that it would only be one minute longer.  Lauren, EMT was asked by this RN to move the patient regardless of whether she had finished her conversation or not, as we needed this room for a critically ill patient, and the Ms. Degroote had been given ample opportunity to end her conversation.  Lauren, EMT asked the patient kindly to move to the waiting room as she had been discharged more than 30 minutes prior.  Pt was informed that there are courtesy phones for use in the waiting room and that she was welcome to use any of those at her convenience after leaving room 12.  Pt voiced understanding and continued to talk on the phone requesting to stay just two more minutes to complete her conversation.  Lauren, EMT stated another patient was coming back and that her phone call would need end when they arrived.  At my direction Lauren, EMT disconnected the phone line and escorted the patient to the waiting room.  The patient became upset and voiced displeasure about the disconnection of her phone call stating that she could not get a rental car now because we hung up on her insurance company.
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
## 2013-01-28 DIAGNOSIS — H353190 Nonexudative age-related macular degeneration, unspecified eye, stage unspecified: Secondary | ICD-10-CM | POA: Insufficient documentation

## 2013-01-28 DIAGNOSIS — H2510 Age-related nuclear cataract, unspecified eye: Secondary | ICD-10-CM | POA: Insufficient documentation

## 2013-01-28 DIAGNOSIS — H04129 Dry eye syndrome of unspecified lacrimal gland: Secondary | ICD-10-CM | POA: Insufficient documentation

## 2013-01-28 DIAGNOSIS — H01003 Unspecified blepharitis right eye, unspecified eyelid: Secondary | ICD-10-CM | POA: Insufficient documentation

## 2013-01-30 ENCOUNTER — Emergency Department (HOSPITAL_BASED_OUTPATIENT_CLINIC_OR_DEPARTMENT_OTHER): Payer: No Typology Code available for payment source

## 2013-01-30 ENCOUNTER — Emergency Department (HOSPITAL_BASED_OUTPATIENT_CLINIC_OR_DEPARTMENT_OTHER)
Admission: EM | Admit: 2013-01-30 | Discharge: 2013-01-30 | Disposition: A | Discharge status: Home / Self Care | Payer: No Typology Code available for payment source | Attending: Emergency Medicine | Admitting: Emergency Medicine

## 2013-01-30 ENCOUNTER — Encounter (HOSPITAL_BASED_OUTPATIENT_CLINIC_OR_DEPARTMENT_OTHER): Payer: Self-pay | Admitting: *Deleted

## 2013-01-30 DIAGNOSIS — Y92410 Unspecified street and highway as the place of occurrence of the external cause: Secondary | ICD-10-CM | POA: Insufficient documentation

## 2013-01-30 DIAGNOSIS — F329 Major depressive disorder, single episode, unspecified: Secondary | ICD-10-CM | POA: Insufficient documentation

## 2013-01-30 DIAGNOSIS — S0990XD Unspecified injury of head, subsequent encounter: Secondary | ICD-10-CM

## 2013-01-30 DIAGNOSIS — F17200 Nicotine dependence, unspecified, uncomplicated: Secondary | ICD-10-CM | POA: Diagnosis not present

## 2013-01-30 DIAGNOSIS — R209 Unspecified disturbances of skin sensation: Secondary | ICD-10-CM | POA: Diagnosis not present

## 2013-01-30 DIAGNOSIS — S0990XA Unspecified injury of head, initial encounter: Secondary | ICD-10-CM | POA: Insufficient documentation

## 2013-01-30 DIAGNOSIS — R51 Headache: Secondary | ICD-10-CM | POA: Insufficient documentation

## 2013-01-30 DIAGNOSIS — S39012D Strain of muscle, fascia and tendon of lower back, subsequent encounter: Secondary | ICD-10-CM

## 2013-01-30 DIAGNOSIS — Z8719 Personal history of other diseases of the digestive system: Secondary | ICD-10-CM | POA: Insufficient documentation

## 2013-01-30 DIAGNOSIS — Y9389 Activity, other specified: Secondary | ICD-10-CM | POA: Insufficient documentation

## 2013-01-30 DIAGNOSIS — H9190 Unspecified hearing loss, unspecified ear: Secondary | ICD-10-CM | POA: Insufficient documentation

## 2013-01-30 DIAGNOSIS — S335XXA Sprain of ligaments of lumbar spine, initial encounter: Secondary | ICD-10-CM | POA: Insufficient documentation

## 2013-01-30 DIAGNOSIS — F3289 Other specified depressive episodes: Secondary | ICD-10-CM | POA: Insufficient documentation

## 2013-01-30 DIAGNOSIS — Z8709 Personal history of other diseases of the respiratory system: Secondary | ICD-10-CM | POA: Insufficient documentation

## 2013-01-30 DIAGNOSIS — Z79899 Other long term (current) drug therapy: Secondary | ICD-10-CM | POA: Insufficient documentation
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
## 2013-01-30 NOTE — ED Notes (Signed)
Pt wishes to finish snack and drink before leaving. Pt allowed to do so in room.

## 2013-01-30 NOTE — ED Notes (Signed)
Offered to ask MD for a rx for pain medication. Pt declined. OTC pain medications discussed with pt. Pt also has previously prescribed tramadol at home.
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
## 2013-01-31 DIAGNOSIS — J323 Chronic sphenoidal sinusitis: Secondary | ICD-10-CM | POA: Insufficient documentation

## 2013-01-31 DIAGNOSIS — M48061 Spinal stenosis, lumbar region without neurogenic claudication: Secondary | ICD-10-CM | POA: Insufficient documentation

## 2013-02-21 DIAGNOSIS — G4700 Insomnia, unspecified: Secondary | ICD-10-CM | POA: Insufficient documentation

## 2013-02-28 DIAGNOSIS — H1013 Acute atopic conjunctivitis, bilateral: Secondary | ICD-10-CM | POA: Insufficient documentation

## 2013-03-12 ENCOUNTER — Telehealth (INDEPENDENT_AMBULATORY_CARE_PROVIDER_SITE_OTHER): Payer: Self-pay

## 2013-03-12 NOTE — Telephone Encounter (Signed)
Patient was advised to call PCP for refill for protonix  per DR. [facility]

## 2013-03-31 ENCOUNTER — Encounter (INDEPENDENT_AMBULATORY_CARE_PROVIDER_SITE_OTHER): Payer: Self-pay

## 2013-04-01 ENCOUNTER — Encounter (INDEPENDENT_AMBULATORY_CARE_PROVIDER_SITE_OTHER): Payer: Self-pay

## 2013-04-18 DIAGNOSIS — I10 Essential (primary) hypertension: Secondary | ICD-10-CM | POA: Insufficient documentation

## 2013-09-03 DIAGNOSIS — I781 Nevus, non-neoplastic: Secondary | ICD-10-CM | POA: Insufficient documentation

## 2013-09-03 DIAGNOSIS — R600 Localized edema: Secondary | ICD-10-CM | POA: Insufficient documentation

## 2013-09-27 DIAGNOSIS — G629 Polyneuropathy, unspecified: Secondary | ICD-10-CM | POA: Insufficient documentation

## 2013-09-27 DIAGNOSIS — G5610 Other lesions of median nerve, unspecified upper limb: Secondary | ICD-10-CM | POA: Insufficient documentation

## 2013-09-27 DIAGNOSIS — G542 Cervical root disorders, not elsewhere classified: Secondary | ICD-10-CM | POA: Insufficient documentation

## 2013-10-22 ENCOUNTER — Emergency Department (HOSPITAL_BASED_OUTPATIENT_CLINIC_OR_DEPARTMENT_OTHER)
Admission: EM | Admit: 2013-10-22 | Discharge: 2013-10-23 | Disposition: A | Discharge status: Home / Self Care | Payer: Medicare Other | Attending: Emergency Medicine | Admitting: Emergency Medicine

## 2013-10-22 ENCOUNTER — Encounter (HOSPITAL_BASED_OUTPATIENT_CLINIC_OR_DEPARTMENT_OTHER): Payer: Self-pay | Admitting: Emergency Medicine

## 2013-10-22 DIAGNOSIS — R609 Edema, unspecified: Secondary | ICD-10-CM | POA: Insufficient documentation

## 2013-10-22 DIAGNOSIS — IMO0002 Reserved for concepts with insufficient information to code with codable children: Secondary | ICD-10-CM | POA: Insufficient documentation

## 2013-10-22 DIAGNOSIS — Z79899 Other long term (current) drug therapy: Secondary | ICD-10-CM | POA: Insufficient documentation

## 2013-10-22 DIAGNOSIS — Z8719 Personal history of other diseases of the digestive system: Secondary | ICD-10-CM | POA: Insufficient documentation

## 2013-10-22 DIAGNOSIS — L0390 Cellulitis, unspecified: Secondary | ICD-10-CM

## 2013-10-22 DIAGNOSIS — L02419 Cutaneous abscess of limb, unspecified: Secondary | ICD-10-CM | POA: Insufficient documentation

## 2013-10-22 DIAGNOSIS — R7989 Other specified abnormal findings of blood chemistry: Secondary | ICD-10-CM | POA: Insufficient documentation

## 2013-10-22 DIAGNOSIS — F329 Major depressive disorder, single episode, unspecified: Secondary | ICD-10-CM | POA: Insufficient documentation

## 2013-10-22 DIAGNOSIS — F17200 Nicotine dependence, unspecified, uncomplicated: Secondary | ICD-10-CM | POA: Insufficient documentation

## 2013-10-22 DIAGNOSIS — L03119 Cellulitis of unspecified part of limb: Principal | ICD-10-CM

## 2013-10-22 DIAGNOSIS — F3289 Other specified depressive episodes: Secondary | ICD-10-CM | POA: Insufficient documentation

## 2013-10-22 DIAGNOSIS — Z8669 Personal history of other diseases of the nervous system and sense organs: Secondary | ICD-10-CM | POA: Insufficient documentation

## 2013-10-22 DIAGNOSIS — Z7982 Long term (current) use of aspirin: Secondary | ICD-10-CM | POA: Insufficient documentation

## 2013-10-22 LAB — CBC WITH DIFFERENTIAL/PLATELET
BASOS PCT: 0 % (ref 0–1)
Basophils Absolute: 0 10*3/uL (ref 0.0–0.1)
EOS ABS: 0.3 10*3/uL (ref 0.0–0.7)
Eosinophils Relative: 4 % (ref 0–5)
HEMATOCRIT: 39.4 % (ref 36.0–46.0)
HEMOGLOBIN: 14 g/dL (ref 12.0–15.0)
LYMPHS ABS: 2.6 10*3/uL (ref 0.7–4.0)
Lymphocytes Relative: 30 % (ref 12–46)
MCH: 32 pg (ref 26.0–34.0)
MCHC: 35.5 g/dL (ref 30.0–36.0)
MCV: 90.2 fL (ref 78.0–100.0)
MONO ABS: 0.7 10*3/uL (ref 0.1–1.0)
MONOS PCT: 8 % (ref 3–12)
NEUTROS PCT: 57 % (ref 43–77)
Neutro Abs: 5 10*3/uL (ref 1.7–7.7)
Platelets: 408 10*3/uL — ABNORMAL HIGH (ref 150–400)
RBC: 4.37 MIL/uL (ref 3.87–5.11)
RDW: 12.9 % (ref 11.5–15.5)
WBC: 8.6 10*3/uL (ref 4.0–10.5)

## 2013-10-22 LAB — COMPREHENSIVE METABOLIC PANEL
ALT: 31 U/L (ref 0–35)
AST: 25 U/L (ref 0–37)
Albumin: 4.2 g/dL (ref 3.5–5.2)
Alkaline Phosphatase: 62 U/L (ref 39–117)
BUN: 9 mg/dL (ref 6–23)
CO2: 30 mEq/L (ref 19–32)
Calcium: 9.7 mg/dL (ref 8.4–10.5)
Chloride: 93 mEq/L — ABNORMAL LOW (ref 96–112)
Creatinine, Ser: 0.9 mg/dL (ref 0.50–1.10)
GFR calc Af Amer: 74 mL/min — ABNORMAL LOW (ref >90)
GFR calc non Af Amer: 64 mL/min — ABNORMAL LOW (ref >90)
Glucose, Bld: 117 mg/dL — ABNORMAL HIGH (ref 70–99)
Potassium: 3.1 mEq/L — ABNORMAL LOW (ref 3.7–5.3)
Sodium: 136 mEq/L — ABNORMAL LOW (ref 137–147)
Total Bilirubin: 0.3 mg/dL (ref 0.3–1.2)
Total Protein: 7.7 g/dL (ref 6.0–8.3)

## 2013-10-22 LAB — D-DIMER, QUANTITATIVE (NOT AT ARMC): D DIMER QUANT: 0.72 ug{FEU}/mL — AB (ref 0.00–0.48)

## 2013-10-22 MED ORDER — POTASSIUM CHLORIDE CRYS ER 20 MEQ PO TBCR
40.0000 meq | EXTENDED_RELEASE_TABLET | Freq: Once | ORAL | Status: AC
  Administered 2013-10-22: 40 meq via ORAL
  Filled 2013-10-22: qty 2

## 2013-10-22 NOTE — ED Notes (Signed)
Pt c/o bil leg swelling x 4 days

## 2013-10-22 NOTE — ED Provider Notes (Signed)
CSN: [id number]     Arrival date & time 10/22/13  2155 History  This chart was scribed for [name] Christiansen Alfonso Patten, MD by Sydell Axon, ED Scribe. This patient was seen in room MH07/MH07 and the patient's care was started at 11:02 PM.  Patient is a 68 y.o. female presenting with leg pain. The history is provided by the patient. No language interpreter was used.  Leg Pain Location:  Leg Time since incident:  10 months Injury: no   Leg location:  L lower leg and R lower leg Pain details:    Quality:  Aching   Severity:  Mild   Onset quality:  Gradual   Timing:  Constant   Progression:  Worsening Chronicity:  Chronic Dislocation: no   Foreign body present:  No foreign bodies Relieved by:  Nothing Worsened by:  Nothing tried Ineffective treatments: narcotics and neurontin and lasix. Associated symptoms: swelling   Associated symptoms: no back pain, no fatigue, no fever, no muscle weakness and no tingling   Risk factors: no concern for non-accidental trauma    HPI Comments: [name] [name] is a 68 y.o. female who presents to the Emergency Department with a chief complaint of recurrent bilateral leg swelling with onset 10 months ago. Patient reports symptoms have recently worsened over the past 4 days. She states that symptoms have worsened with a recent medication change to GABApentin. Alternatively, patient reports that Symbicort and lasix for treatment of the swelling. Patient states her PCP is Dr. Salvadore Dom in [place]. Patient reports that she is scheduled to revisit her Neurosurgeon.   Past Medical History  Diagnosis Date  . Sinus drainage   . Deviated septum   . Hearing loss   . Depression   . Sleep apnea   . Diverticulitis 2014   Past Surgical History  Procedure Laterality Date  . Appendectomy    . Breast surgery    . Neck surgery    . Back surgery    . Cesarean section    . Eye surgery    . Foot surgery    . Foot surgery    . Foot surgery Left    Family  History  Problem Relation Age of Onset  . Kidney failure Mother    History  Substance Use Topics  . Smoking status: Current Every Day Smoker    Types: Cigarettes  . Smokeless tobacco: Not on file  . Alcohol Use: No   OB History   Grav Para Term Preterm Abortions TAB SAB Ect Mult Living                 Review of Systems  Constitutional: Negative for fever, chills, diaphoresis and fatigue.  HENT: Negative for congestion, rhinorrhea and sneezing.   Eyes: Negative.   Respiratory: Negative for cough and shortness of breath.   Cardiovascular: Positive for leg swelling. Negative for chest pain.  Gastrointestinal: Negative for nausea, vomiting, abdominal pain, diarrhea and blood in stool.  Genitourinary: Negative for difficulty urinating.  Musculoskeletal: Negative for arthralgias and back pain.  Skin: Positive for color change. Negative for rash and wound.  Neurological: Negative for weakness, numbness and headaches.  All other systems reviewed and are negative.  Allergies  Tape  Home Medications   Prior to Admission medications   Medication Sig Start Date End Date Taking? Authorizing Provider  albuterol (PROVENTIL HFA;VENTOLIN HFA) 108 (90 BASE) MCG/ACT inhaler Inhale 2 puffs into the lungs every 6 (six) hours as needed for wheezing.    Historical  Provider, MD  alendronate (FOSAMAX) 70 MG tablet Take 70 mg by mouth every 7 (seven) days. Take with a full glass of water on an empty stomach on Sundays    Historical Provider, MD  aspirin 81 MG tablet Take 81 mg by mouth every morning. Take an additional tablet if needed for leg cramps in the evening    Historical Provider, MD  Bilberry 1000 MG CAPS Take 1 tablet by mouth every morning.     Historical Provider, MD  budesonide-formoterol (SYMBICORT) 160-4.5 MCG/ACT inhaler Inhale 2 puffs into the lungs daily as needed (for wheezing).    Historical Provider, MD  buPROPion (WELLBUTRIN SR) 150 MG 12 hr tablet Take 150 mg by mouth 2 (two)  times daily.    Historical Provider, MD  cetirizine (ZYRTEC) 10 MG tablet Take 10 mg by mouth every morning.     Historical Provider, MD  cholecalciferol (VITAMIN D) 1000 UNITS tablet Take 1,000 Units by mouth every morning.    Historical Provider, MD  CHROMIUM PICOLINATE PO Take 1 tablet by mouth every morning.     Historical Provider, MD  citalopram (CELEXA) 40 MG tablet Take 40 mg by mouth every morning.    Historical Provider, MD  cyclobenzaprine (FLEXERIL) 10 MG tablet Take 10 mg by mouth 2 (two) times daily as needed for muscle spasms.    Historical Provider, MD  fluconazole (DIFLUCAN) 200 MG tablet Take 1 tablet (200 mg total) by mouth daily. 12/11/12   Luella Cook III, MD  Magnesium 500 MG TABS Take 1 tablet by mouth every morning.     Historical Provider, MD  Multiple Vitamin (MULTIVITAMIN WITH MINERALS) TABS Take 1 tablet by mouth every morning.    Historical Provider, MD  nicotine (NICODERM CQ) 21 mg/24hr patch Place 1 patch onto the skin daily. 11/21/12   Emina Riebock, NP  pantoprazole (PROTONIX) 40 MG tablet Take 1 tablet (40 mg total) by mouth daily. 11/21/12   Emina Riebock, NP  sodium chloride (MURO 128) 2 % ophthalmic solution Place 1 drop into both eyes every morning.    Historical Provider, MD  traMADol (ULTRAM) 50 MG tablet Take 50 mg by mouth every 8 (eight) hours as needed for pain.     Historical Provider, MD  vitamin B-12 (CYANOCOBALAMIN) 1000 MCG tablet Take 1,000 mcg by mouth every morning.     Historical Provider, MD   Triage Vitals: BP 126/63  Pulse 86  Temp(Src) 98.6 F (37 C) (Oral)  Resp 16  Ht 5\' 2"  (1.575 m)  Wt 186 lb (84.369 kg)  BMI 34.01 kg/m2  SpO2 100%  Physical Exam  Constitutional: She is oriented to person, place, and time. She appears well-developed and well-nourished.  HENT:  Head: Normocephalic and atraumatic.  Mouth/Throat: Oropharynx is clear and moist.  Eyes: Pupils are equal, round, and reactive to light.  Neck: Normal range of motion.  Neck supple.  Cardiovascular: Normal rate, regular rhythm, normal heart sounds and intact distal pulses.   Cap refill less than 2 seconds  Pulmonary/Chest: Effort normal and breath sounds normal. No respiratory distress.  Abdominal: Soft. Bowel sounds are normal. There is no tenderness. There is no rebound and no guarding.  Musculoskeletal: Normal range of motion. She exhibits edema. She exhibits no tenderness.       Right lower leg: She exhibits edema.       Left lower leg: She exhibits edema.  No cords, symmetric pitting edema to the knees   Mild erythema and warmth  of the left distal shin consistent with mild cellulitis  B feet and LE neurovascularly intact cap refill < 2 sec intact dorsalis pedis B  Lymphadenopathy:    She has no cervical adenopathy.  Neurological: She is alert and oriented to person, place, and time. She has normal reflexes.  Skin: Skin is warm and dry. No rash noted.  Psychiatric: She has a normal mood and affect. She is agitated.    ED Course  Procedures (including critical care time)   COORDINATION OF CARE: 11:07 PM-Discussed the skin infection present on the L lower leg. Will prescribe antibiotic. Treatment plan discussed with patient and patient agrees.  Labs Review Labs Reviewed  CBC WITH DIFFERENTIAL - Abnormal; Notable for the following:    Platelets 408 (*)    All other components within normal limits  COMPREHENSIVE METABOLIC PANEL - Abnormal; Notable for the following:    Sodium 136 (*)    Potassium 3.1 (*)    Chloride 93 (*)    Glucose, Bld 117 (*)    GFR calc non Af Amer 64 (*)    GFR calc Af Amer 74 (*)    All other components within normal limits   MDM   Final diagnoses:  None    Will treat for cellulitis and given a shot of lovenox and r/o for dvt in am. Follow up with your family doctor for ongoing care continue your potassium.  Have supplemented here.  And continue your lasix.    I personally performed the services described in  this documentation, which was scribed in my presence. The recorded information has been reviewed and is accurate.    Carlisle Beers, MD 10/23/13 [phone]

## 2013-10-22 NOTE — ED Notes (Signed)
Lower ext swelling and redness w burning, and stinging  Has been seen for same in past

## 2013-10-23 ENCOUNTER — Ambulatory Visit (HOSPITAL_BASED_OUTPATIENT_CLINIC_OR_DEPARTMENT_OTHER)
Admit: 2013-10-23 | Discharge: 2013-10-23 | Disposition: A | Discharge status: Home / Self Care | Payer: Medicare Other | Attending: Emergency Medicine | Admitting: Emergency Medicine

## 2013-10-23 ENCOUNTER — Emergency Department (HOSPITAL_BASED_OUTPATIENT_CLINIC_OR_DEPARTMENT_OTHER): Payer: Medicare Other

## 2013-10-23 ENCOUNTER — Encounter (HOSPITAL_BASED_OUTPATIENT_CLINIC_OR_DEPARTMENT_OTHER): Payer: Self-pay | Admitting: Emergency Medicine

## 2013-10-23 DIAGNOSIS — R609 Edema, unspecified: Secondary | ICD-10-CM

## 2013-10-23 MED ORDER — ENOXAPARIN SODIUM 80 MG/0.8ML ~~LOC~~ SOLN
SUBCUTANEOUS | Status: AC
  Administered 2013-10-23: 80 mg
  Filled 2013-10-23: qty 0.8

## 2013-10-23 MED ORDER — ENOXAPARIN SODIUM 80 MG/0.8ML ~~LOC~~ SOLN: 80.0000 mg | Freq: Once | SUBCUTANEOUS | Status: DC

## 2013-10-23 MED ORDER — CEPHALEXIN 500 MG PO CAPS: 500.0000 mg | ORAL_CAPSULE | Freq: Four times a day (QID) | ORAL | Status: DC

## 2013-10-23 MED ORDER — CEPHALEXIN 250 MG PO CAPS
500.0000 mg | ORAL_CAPSULE | Freq: Once | ORAL | Status: AC
  Administered 2013-10-23: 500 mg via ORAL
  Filled 2013-10-23: qty 2

## 2013-10-23 MED ORDER — DOXYCYCLINE HYCLATE 100 MG PO CAPS: 100.0000 mg | ORAL_CAPSULE | Freq: Two times a day (BID) | ORAL | Status: DC

## 2013-10-23 MED ORDER — DOXYCYCLINE HYCLATE 100 MG IV SOLR: 100.0000 mg | Freq: Once | INTRAVENOUS | Status: DC

## 2013-10-23 MED ORDER — IOHEXOL 350 MG/ML SOLN
80.0000 mL | Freq: Once | INTRAVENOUS | Status: AC | PRN
  Administered 2013-10-23: 80 mL via INTRAVENOUS

## 2013-10-23 NOTE — ED Notes (Signed)
[facility] called to dose Lovenox,  80 mg sq

## 2013-10-23 NOTE — Discharge Instructions (Signed)
Cellulitis °Cellulitis is an infection of the skin and the tissue under the skin. The infected area is usually red and tender. This happens most often in the arms and lower legs. °HOME CARE  °· Take your antibiotic medicine as told. Finish the medicine even if you start to feel better. °· Keep the infected arm or leg raised (elevated). °· Put a warm cloth on the area up to 4 times per day. °· Only take medicines as told by your doctor. °· Keep all doctor visits as told. °GET HELP RIGHT AWAY IF:  °· You have a fever. °· You feel very sleepy. °· You throw up (vomit) or have watery poop (diarrhea). °· You feel sick and have muscle aches and pains. °· You see red streaks on the skin coming from the infected area. °· Your red area gets bigger or turns a dark color. °· Your bone or joint under the infected area is painful after the skin heals. °· Your infection comes back in the same area or different area. °· You have a puffy (swollen) bump in the infected area. °· You have new symptoms. °MAKE SURE YOU:  °· Understand these instructions. °· Will watch your condition. °· Will get help right away if you are not doing well or get worse. °Document Released: 12/06/2007 Document Revised: 12/19/2011 Document Reviewed: 09/04/2011 °[employer]® Patient Information ©2014 [employer]. ° °

## 2014-03-21 DIAGNOSIS — E785 Hyperlipidemia, unspecified: Secondary | ICD-10-CM | POA: Insufficient documentation

## 2014-04-16 ENCOUNTER — Emergency Department (HOSPITAL_BASED_OUTPATIENT_CLINIC_OR_DEPARTMENT_OTHER)
Admission: EM | Admit: 2014-04-16 | Discharge: 2014-04-17 | Disposition: A | Discharge status: Home / Self Care | Payer: Medicare Other | Attending: Emergency Medicine | Admitting: Emergency Medicine

## 2014-04-16 ENCOUNTER — Encounter (HOSPITAL_BASED_OUTPATIENT_CLINIC_OR_DEPARTMENT_OTHER): Payer: Self-pay | Admitting: Emergency Medicine

## 2014-04-16 ENCOUNTER — Emergency Department (HOSPITAL_BASED_OUTPATIENT_CLINIC_OR_DEPARTMENT_OTHER): Payer: Medicare Other

## 2014-04-16 DIAGNOSIS — Z8719 Personal history of other diseases of the digestive system: Secondary | ICD-10-CM | POA: Diagnosis not present

## 2014-04-16 DIAGNOSIS — L03119 Cellulitis of unspecified part of limb: Secondary | ICD-10-CM | POA: Insufficient documentation

## 2014-04-16 DIAGNOSIS — Z79899 Other long term (current) drug therapy: Secondary | ICD-10-CM | POA: Diagnosis not present

## 2014-04-16 DIAGNOSIS — Z7982 Long term (current) use of aspirin: Secondary | ICD-10-CM | POA: Insufficient documentation

## 2014-04-16 DIAGNOSIS — L03115 Cellulitis of right lower limb: Secondary | ICD-10-CM | POA: Insufficient documentation

## 2014-04-16 DIAGNOSIS — Z7901 Long term (current) use of anticoagulants: Secondary | ICD-10-CM | POA: Insufficient documentation

## 2014-04-16 DIAGNOSIS — H9190 Unspecified hearing loss, unspecified ear: Secondary | ICD-10-CM | POA: Diagnosis not present

## 2014-04-16 DIAGNOSIS — M7989 Other specified soft tissue disorders: Secondary | ICD-10-CM

## 2014-04-16 DIAGNOSIS — Z720 Tobacco use: Secondary | ICD-10-CM | POA: Insufficient documentation

## 2014-04-16 DIAGNOSIS — Z8709 Personal history of other diseases of the respiratory system: Secondary | ICD-10-CM | POA: Insufficient documentation

## 2014-04-16 DIAGNOSIS — M79605 Pain in left leg: Secondary | ICD-10-CM | POA: Diagnosis present

## 2014-04-16 DIAGNOSIS — F329 Major depressive disorder, single episode, unspecified: Secondary | ICD-10-CM | POA: Diagnosis not present

## 2014-04-16 MED ORDER — HYDROMORPHONE HCL 1 MG/ML IJ SOLN
1.0000 mg | Freq: Once | INTRAMUSCULAR | Status: AC
  Administered 2014-04-16: 1 mg via INTRAMUSCULAR
  Filled 2014-04-16: qty 1

## 2014-04-16 MED ORDER — LIDOCAINE HCL (PF) 1 % IJ SOLN
INTRAMUSCULAR | Status: AC
  Administered 2014-04-16: 5 mL
  Filled 2014-04-16: qty 5

## 2014-04-16 MED ORDER — CEFTRIAXONE SODIUM 1 G IJ SOLR
1.0000 g | Freq: Once | INTRAMUSCULAR | Status: AC
  Administered 2014-04-16: 1 g via INTRAMUSCULAR
  Filled 2014-04-16: qty 10

## 2014-04-16 NOTE — ED Notes (Signed)
C/o hx of lower back pain with swelling to legs-redness to bilat LEs prompted pt to come to ED

## 2014-04-16 NOTE — ED Provider Notes (Addendum)
CSN: [id number]     Arrival date & time 04/16/14  2102 History  This chart was scribed for Veryl Speak, MD by Delphia Grates, ED Scribe. This patient was seen in room MH01/MH01 and the patient's care was started at 11:00 PM.    Chief Complaint  Patient presents with  . Bilateral lower leg pain,redness,edema       The history is provided by the patient. No language interpreter was used.    HPI Comments: [name] [name] is a 68 y.o. female who presents to the Emergency Department complaining of swelling to her bilateral legs and feet since yesterday. Patient was prompted the come the ED and is concerned for possible blood clot. There is associated pain and redness. She notes the right leg is worse than the left. She also reports a chronic lower back pain that has gotten worse since onset of symptoms. She has taken one dose of hydrocodone PTA without relief of her pain. She has past history of cellulitis that was treated with Keflex.   Past Medical History  Diagnosis Date  . Sinus drainage   . Deviated septum   . Hearing loss   . Depression   . Sleep apnea   . Diverticulitis 2014   Past Surgical History  Procedure Laterality Date  . Appendectomy    . Breast surgery    . Neck surgery    . Back surgery    . Cesarean section    . Eye surgery    . Foot surgery    . Foot surgery    . Foot surgery Left    Family History  Problem Relation Age of Onset  . Kidney failure Mother    History  Substance Use Topics  . Smoking status: Current Every Day Smoker    Types: Cigarettes  . Smokeless tobacco: Not on file  . Alcohol Use: No   OB History   Grav Para Term Preterm Abortions TAB SAB Ect Mult Living                 Review of Systems A complete 10 system review of systems was obtained and all systems are negative except as noted in the HPI and PMH.     Allergies  Doxycycline; Gabapentin; Other; and Tape  Home Medications   Prior to Admission medications    Medication Sig Start Date End Date Taking? Authorizing Provider  atorvastatin (LIPITOR) 10 MG tablet Take 10 mg by mouth daily.   Yes Historical Provider, MD  DULoxetine (CYMBALTA) 60 MG capsule Take 60 mg by mouth daily.   Yes Historical Provider, MD  furosemide (LASIX) 20 MG tablet Take 20 mg by mouth.   Yes Historical Provider, MD  OXYCODONE HCL PO Take by mouth.   Yes Historical Provider, MD  tiZANidine (ZANAFLEX) 4 MG tablet Take 4 mg by mouth every 6 (six) hours as needed for muscle spasms.   Yes Historical Provider, MD  tolterodine (DETROL) 2 MG tablet Take 2 mg by mouth 2 (two) times daily.   Yes Historical Provider, MD  albuterol (PROVENTIL HFA;VENTOLIN HFA) 108 (90 BASE) MCG/ACT inhaler Inhale 2 puffs into the lungs every 6 (six) hours as needed for wheezing.    Historical Provider, MD  alendronate (FOSAMAX) 70 MG tablet Take 70 mg by mouth every 7 (seven) days. Take with a full glass of water on an empty stomach on Sundays    Historical Provider, MD  aspirin 81 MG tablet Take 81 mg by mouth every  morning. Take an additional tablet if needed for leg cramps in the evening    Historical Provider, MD  Bilberry 1000 MG CAPS Take 1 tablet by mouth every morning.     Historical Provider, MD  budesonide-formoterol (SYMBICORT) 160-4.5 MCG/ACT inhaler Inhale 2 puffs into the lungs daily as needed (for wheezing).    Historical Provider, MD  buPROPion (WELLBUTRIN SR) 150 MG 12 hr tablet Take 150 mg by mouth 2 (two) times daily.    Historical Provider, MD  cephALEXin (KEFLEX) 500 MG capsule Take 1 capsule (500 mg total) by mouth 4 (four) times daily. 10/23/13   April K Palumbo-Rasch, MD  cetirizine (ZYRTEC) 10 MG tablet Take 10 mg by mouth every morning.     Historical Provider, MD  cholecalciferol (VITAMIN D) 1000 UNITS tablet Take 1,000 Units by mouth every morning.    Historical Provider, MD  CHROMIUM PICOLINATE PO Take 1 tablet by mouth every morning.     Historical Provider, MD  citalopram  (CELEXA) 40 MG tablet Take 40 mg by mouth every morning.    Historical Provider, MD  cyclobenzaprine (FLEXERIL) 10 MG tablet Take 10 mg by mouth 2 (two) times daily as needed for muscle spasms.    Historical Provider, MD  fluconazole (DIFLUCAN) 200 MG tablet Take 1 tablet (200 mg total) by mouth daily. 12/11/12   Autumn Messing III, MD  HYDROcodone-acetaminophen (NORCO/VICODIN) 5-325 MG per tablet Take 1 tablet by mouth every 6 (six) hours as needed for moderate pain.    Historical Provider, MD  Magnesium 500 MG TABS Take 1 tablet by mouth every morning.     Historical Provider, MD  Multiple Vitamin (MULTIVITAMIN WITH MINERALS) TABS Take 1 tablet by mouth every morning.    Historical Provider, MD  nicotine (NICODERM CQ) 21 mg/24hr patch Place 1 patch onto the skin daily. 11/21/12   Emina Riebock, NP  pantoprazole (PROTONIX) 40 MG tablet Take 1 tablet (40 mg total) by mouth daily. 11/21/12   Emina Riebock, NP  potassium chloride (K-DUR,KLOR-CON) 10 MEQ tablet Take 10 mEq by mouth 2 (two) times daily.    Historical Provider, MD  sodium chloride (MURO 128) 2 % ophthalmic solution Place 1 drop into both eyes every morning.    Historical Provider, MD  traMADol (ULTRAM) 50 MG tablet Take 50 mg by mouth every 8 (eight) hours as needed for pain.     Historical Provider, MD  triamterene-hydrochlorothiazide (DYAZIDE) 37.5-25 MG per capsule Take 1 capsule by mouth daily.    Historical Provider, MD  vitamin B-12 (CYANOCOBALAMIN) 1000 MCG tablet Take 1,000 mcg by mouth every morning.     Historical Provider, MD   Triage Vitals: BP 137/62  Pulse 88  Temp(Src) 98.6 F (37 C) (Oral)  Resp 20  SpO2 100%  Physical Exam  Nursing note and vitals reviewed. Constitutional: She is oriented to person, place, and time. She appears well-developed and well-nourished. No distress.  HENT:  Head: Normocephalic and atraumatic.  Eyes: Conjunctivae and EOM are normal.  Neck: Neck supple. No tracheal deviation present.   Cardiovascular: Normal rate, regular rhythm and normal heart sounds.   Pulmonary/Chest: Effort normal and breath sounds normal. No respiratory distress.  Musculoskeletal: Normal range of motion. She exhibits edema and tenderness.  The medial aspect of the right lower leg is noted to have warmth, redness, and edema. Is tender to the touch. DP pulses are palpable bilaterally. There is some swelling and erythema to the left lower leg, however, to a lesser degree.  Neurological: She is alert and oriented to person, place, and time.  Skin: Skin is warm and dry.  Psychiatric: She has a normal mood and affect. Her behavior is normal.    ED Course  Procedures (including critical care time)  DIAGNOSTIC STUDIES: Oxygen Saturation is 100% on room air, normal by my interpretation.    COORDINATION OF CARE: At 2306 Discussed treatment plan with patient which includes ABX and [place]. Patient agrees.   Labs Review Labs Reviewed - No data to display  Imaging Review No results found.   EKG Interpretation   Date/Time:  Friday April 17 2014 01:46:08 EDT Ventricular Rate:  77 PR Interval:  174 QRS Duration: 84 QT Interval:  442 QTC Calculation: 500 R Axis:   71 Text Interpretation:  Normal sinus rhythm with sinus arrhythmia Normal ECG  Confirmed by DELOS  MD, Mercedez Boule ([id number]) on 04/17/2014 1:46:32 AM      MDM   Final diagnoses:  None    Patient presents with bilateral leg pain and swelling. Ultrasound are negative for DVT. Her right leg is more swollen and red than the left and this appears to be cellulitis. She was given ceftriaxone and will be discharged with Keflex and followup with PCP for recheck in the next 2 days. She is afebrile and nontoxic appearing. Her vitals are stable and I believe she is appropriate for discharge with oral antibiotics. She understands to return if her symptoms worsen or change.  I personally performed the services described in this documentation, which was  scribed in my presence. The recorded information has been reviewed and is accurate.      Veryl Speak, MD 04/17/14 0119   Addendum: When I returned to the patient's room to explain the results of her tests. She presented me with multiple other complaints including headache, difficulty breathing and feeling as if she is "smothered", left hip pain, low back pain, feeling as if her "body is jerking". I have witnessed no such "jerking" movements and her vital signs are extremely stable. There is no hypoxia, tachycardia, respiratory distress. I attempted to reassure her that I did not feel as though her symptoms were indicative of an emergent process, however she continued to make these complaints.  I initiated further workup including CBC, metabolic panel, troponin, and EKG. All of these were essentially unremarkable. She is on multiple medications and attempted to ask me questions about which of these I felt she should and shouldn't take. My advice to her was to followup with her primary Dr. to have this discussion with him. Due to my reluctance to extensively workup each and every complaint she made, she insinuated that I felt she was making the symptoms up. I assured her I did not feel that way, and explained to her that I could not image every body part as this would expose her to unnecessary radiation.  In conclusion, this was a difficult situation as the patient had multiple complaints and seemed determined to be admitted. She was afebrile, had no white count and the cellulitis on her leg was minimal and I believe appropriate to manage as an outpatient. Although I explained to her my rationale for the disposition, she appeared unhappy with my decision to discharge her to home.  Veryl Speak, MD 04/17/14 [phone]

## 2014-04-17 DIAGNOSIS — L03119 Cellulitis of unspecified part of limb: Secondary | ICD-10-CM | POA: Diagnosis not present

## 2014-04-17 LAB — CBC WITH DIFFERENTIAL/PLATELET
BASOS PCT: 0 % (ref 0–1)
Basophils Absolute: 0 10*3/uL (ref 0.0–0.1)
Eosinophils Absolute: 0.2 10*3/uL (ref 0.0–0.7)
Eosinophils Relative: 2 % (ref 0–5)
HCT: 34.3 % — ABNORMAL LOW (ref 36.0–46.0)
Hemoglobin: 11.7 g/dL — ABNORMAL LOW (ref 12.0–15.0)
LYMPHS ABS: 3.5 10*3/uL (ref 0.7–4.0)
LYMPHS PCT: 35 % (ref 12–46)
MCH: 31.5 pg (ref 26.0–34.0)
MCHC: 34.1 g/dL (ref 30.0–36.0)
MCV: 92.5 fL (ref 78.0–100.0)
MONO ABS: 0.7 10*3/uL (ref 0.1–1.0)
Monocytes Relative: 7 % (ref 3–12)
NEUTROS ABS: 5.7 10*3/uL (ref 1.7–7.7)
NEUTROS PCT: 56 % (ref 43–77)
Platelets: 362 10*3/uL (ref 150–400)
RBC: 3.71 MIL/uL — AB (ref 3.87–5.11)
RDW: 13 % (ref 11.5–15.5)
WBC: 10.2 10*3/uL (ref 4.0–10.5)

## 2014-04-17 LAB — COMPREHENSIVE METABOLIC PANEL
ALBUMIN: 3.5 g/dL (ref 3.5–5.2)
ALK PHOS: 81 U/L (ref 39–117)
ALT: 23 U/L (ref 0–35)
ANION GAP: 14 (ref 5–15)
AST: 21 U/L (ref 0–37)
BUN: 13 mg/dL (ref 6–23)
CHLORIDE: 96 meq/L (ref 96–112)
CO2: 26 meq/L (ref 19–32)
Calcium: 9 mg/dL (ref 8.4–10.5)
Creatinine, Ser: 0.8 mg/dL (ref 0.50–1.10)
GFR calc Af Amer: 86 mL/min — ABNORMAL LOW (ref >90)
GFR, EST NON AFRICAN AMERICAN: 74 mL/min — AB (ref >90)
GLUCOSE: 121 mg/dL — AB (ref 70–99)
POTASSIUM: 3.4 meq/L — AB (ref 3.7–5.3)
Sodium: 136 mEq/L — ABNORMAL LOW (ref 137–147)
Total Protein: 6.8 g/dL (ref 6.0–8.3)

## 2014-04-17 LAB — TROPONIN I: Troponin I: <0.3 ng/mL (ref <0.30)

## 2014-04-17 MED ORDER — CEPHALEXIN 500 MG PO CAPS: 500.0000 mg | ORAL_CAPSULE | Freq: Four times a day (QID) | ORAL | Status: DC

## 2014-04-17 MED ORDER — OXYCODONE-ACETAMINOPHEN 5-325 MG PO TABS: 2.0000 | ORAL_TABLET | ORAL | Status: DC | PRN

## 2014-04-17 MED ORDER — FLUCONAZOLE 150 MG PO TABS: 150.0000 mg | ORAL_TABLET | Freq: Every day | ORAL | Status: DC

## 2014-04-17 NOTE — ED Notes (Signed)
EDP states to hold discharge at this time, to await lab results.

## 2014-04-17 NOTE — Discharge Instructions (Signed)
Keflex as prescribed.  Percocet as prescribed as needed for pain.  Followup with your primary Dr. next week to be rechecked, and return to the ER if you develop high fever, vomiting, worsening pain, or other new and concerning symptoms.   Cellulitis Cellulitis is an infection of the skin and the tissue beneath it. The infected area is usually red and tender. Cellulitis occurs most often in the arms and lower legs.  CAUSES  Cellulitis is caused by bacteria that enter the skin through cracks or cuts in the skin. The most common types of bacteria that cause cellulitis are staphylococci and streptococci. SIGNS AND SYMPTOMS   Redness and warmth.  Swelling.  Tenderness or pain.  Fever. DIAGNOSIS  Your health care provider can usually determine what is wrong based on a physical exam. Blood tests may also be done. TREATMENT  Treatment usually involves taking an antibiotic medicine. HOME CARE INSTRUCTIONS   Take your antibiotic medicine as directed by your health care provider. Finish the antibiotic even if you start to feel better.  Keep the infected arm or leg elevated to reduce swelling.  Apply a warm cloth to the affected area up to 4 times per day to relieve pain.  Take medicines only as directed by your health care provider.  Keep all follow-up visits as directed by your health care provider. SEEK MEDICAL CARE IF:   You notice red streaks coming from the infected area.  Your red area gets larger or turns dark in color.  Your bone or joint underneath the infected area becomes painful after the skin has healed.  Your infection returns in the same area or another area.  You notice a swollen bump in the infected area.  You develop new symptoms.  You have a fever. SEEK IMMEDIATE MEDICAL CARE IF:   You feel very sleepy.  You develop vomiting or diarrhea.  You have a general ill feeling (malaise) with muscle aches and pains. MAKE SURE YOU:   Understand these  instructions.  Will watch your condition.  Will get help right away if you are not doing well or get worse. Document Released: 03/29/2005 Document Revised: 11/03/2013 Document Reviewed: 09/04/2011 [facility] Patient Information 2015 [place], [place]. This information is not intended to replace advice given to you by your health care provider. Make sure you discuss any questions you have with your health care provider.

## 2014-04-17 NOTE — ED Notes (Signed)
Unable to take Vitals Pt states BP cuff is to tight

## 2014-04-23 DIAGNOSIS — Z79899 Other long term (current) drug therapy: Secondary | ICD-10-CM | POA: Insufficient documentation

## 2014-06-04 DIAGNOSIS — Z79899 Other long term (current) drug therapy: Secondary | ICD-10-CM | POA: Insufficient documentation

## 2015-06-03 DIAGNOSIS — H3589 Other specified retinal disorders: Secondary | ICD-10-CM | POA: Insufficient documentation

## 2015-06-03 DIAGNOSIS — H18519 Endothelial corneal dystrophy, unspecified eye: Secondary | ICD-10-CM | POA: Insufficient documentation

## 2015-06-03 DIAGNOSIS — H04123 Dry eye syndrome of bilateral lacrimal glands: Secondary | ICD-10-CM | POA: Insufficient documentation

## 2015-06-03 DIAGNOSIS — H1851 Endothelial corneal dystrophy: Secondary | ICD-10-CM

## 2015-07-07 DIAGNOSIS — G4733 Obstructive sleep apnea (adult) (pediatric): Secondary | ICD-10-CM | POA: Diagnosis not present

## 2015-07-07 DIAGNOSIS — G5603 Carpal tunnel syndrome, bilateral upper limbs: Secondary | ICD-10-CM | POA: Diagnosis not present

## 2015-07-07 DIAGNOSIS — M5412 Radiculopathy, cervical region: Secondary | ICD-10-CM | POA: Diagnosis not present

## 2015-07-07 DIAGNOSIS — G603 Idiopathic progressive neuropathy: Secondary | ICD-10-CM | POA: Diagnosis not present

## 2015-07-07 DIAGNOSIS — N39498 Other specified urinary incontinence: Secondary | ICD-10-CM | POA: Diagnosis not present

## 2015-07-07 DIAGNOSIS — R201 Hypoesthesia of skin: Secondary | ICD-10-CM | POA: Diagnosis not present

## 2015-07-07 DIAGNOSIS — F5101 Primary insomnia: Secondary | ICD-10-CM | POA: Diagnosis not present

## 2015-07-07 DIAGNOSIS — M5417 Radiculopathy, lumbosacral region: Secondary | ICD-10-CM | POA: Diagnosis not present

## 2015-07-30 DIAGNOSIS — H2513 Age-related nuclear cataract, bilateral: Secondary | ICD-10-CM | POA: Diagnosis not present

## 2015-08-11 DIAGNOSIS — Z7982 Long term (current) use of aspirin: Secondary | ICD-10-CM | POA: Diagnosis not present

## 2015-08-11 DIAGNOSIS — F17200 Nicotine dependence, unspecified, uncomplicated: Secondary | ICD-10-CM | POA: Diagnosis not present

## 2015-08-11 DIAGNOSIS — Z79899 Other long term (current) drug therapy: Secondary | ICD-10-CM | POA: Diagnosis not present

## 2015-08-11 DIAGNOSIS — E785 Hyperlipidemia, unspecified: Secondary | ICD-10-CM | POA: Diagnosis not present

## 2015-08-11 DIAGNOSIS — M1990 Unspecified osteoarthritis, unspecified site: Secondary | ICD-10-CM | POA: Diagnosis not present

## 2015-08-11 DIAGNOSIS — I10 Essential (primary) hypertension: Secondary | ICD-10-CM | POA: Diagnosis not present

## 2015-08-11 DIAGNOSIS — H04123 Dry eye syndrome of bilateral lacrimal glands: Secondary | ICD-10-CM | POA: Diagnosis not present

## 2015-08-11 DIAGNOSIS — G5610 Other lesions of median nerve, unspecified upper limb: Secondary | ICD-10-CM | POA: Diagnosis not present

## 2015-08-11 DIAGNOSIS — F419 Anxiety disorder, unspecified: Secondary | ICD-10-CM | POA: Diagnosis not present

## 2015-08-11 DIAGNOSIS — J449 Chronic obstructive pulmonary disease, unspecified: Secondary | ICD-10-CM | POA: Diagnosis not present

## 2015-08-11 DIAGNOSIS — Z91048 Other nonmedicinal substance allergy status: Secondary | ICD-10-CM | POA: Diagnosis not present

## 2015-08-11 DIAGNOSIS — Z883 Allergy status to other anti-infective agents status: Secondary | ICD-10-CM | POA: Diagnosis not present

## 2015-08-11 DIAGNOSIS — H2511 Age-related nuclear cataract, right eye: Secondary | ICD-10-CM | POA: Diagnosis not present

## 2015-08-11 DIAGNOSIS — R32 Unspecified urinary incontinence: Secondary | ICD-10-CM | POA: Diagnosis not present

## 2015-08-11 DIAGNOSIS — G4733 Obstructive sleep apnea (adult) (pediatric): Secondary | ICD-10-CM | POA: Diagnosis not present

## 2015-08-11 DIAGNOSIS — H9193 Unspecified hearing loss, bilateral: Secondary | ICD-10-CM | POA: Diagnosis not present

## 2015-08-11 DIAGNOSIS — Z888 Allergy status to other drugs, medicaments and biological substances status: Secondary | ICD-10-CM | POA: Diagnosis not present

## 2015-08-11 DIAGNOSIS — H52221 Regular astigmatism, right eye: Secondary | ICD-10-CM | POA: Insufficient documentation

## 2015-08-11 DIAGNOSIS — G629 Polyneuropathy, unspecified: Secondary | ICD-10-CM | POA: Diagnosis not present

## 2015-08-12 DIAGNOSIS — Z961 Presence of intraocular lens: Secondary | ICD-10-CM | POA: Insufficient documentation

## 2015-09-08 DIAGNOSIS — E6609 Other obesity due to excess calories: Secondary | ICD-10-CM | POA: Diagnosis not present

## 2015-09-08 DIAGNOSIS — F17210 Nicotine dependence, cigarettes, uncomplicated: Secondary | ICD-10-CM | POA: Diagnosis not present

## 2015-09-08 DIAGNOSIS — G4733 Obstructive sleep apnea (adult) (pediatric): Secondary | ICD-10-CM | POA: Diagnosis not present

## 2015-09-08 DIAGNOSIS — G4719 Other hypersomnia: Secondary | ICD-10-CM | POA: Diagnosis not present

## 2015-09-14 DIAGNOSIS — G603 Idiopathic progressive neuropathy: Secondary | ICD-10-CM | POA: Diagnosis not present

## 2015-09-14 DIAGNOSIS — M542 Cervicalgia: Secondary | ICD-10-CM | POA: Diagnosis not present

## 2015-09-14 DIAGNOSIS — G5602 Carpal tunnel syndrome, left upper limb: Secondary | ICD-10-CM | POA: Diagnosis not present

## 2015-09-14 DIAGNOSIS — G5601 Carpal tunnel syndrome, right upper limb: Secondary | ICD-10-CM | POA: Diagnosis not present

## 2015-09-14 DIAGNOSIS — M5417 Radiculopathy, lumbosacral region: Secondary | ICD-10-CM | POA: Diagnosis not present

## 2015-10-22 DIAGNOSIS — M5442 Lumbago with sciatica, left side: Secondary | ICD-10-CM | POA: Diagnosis not present

## 2015-10-22 DIAGNOSIS — M542 Cervicalgia: Secondary | ICD-10-CM | POA: Diagnosis not present

## 2015-10-22 DIAGNOSIS — M5441 Lumbago with sciatica, right side: Secondary | ICD-10-CM | POA: Diagnosis not present

## 2015-10-22 DIAGNOSIS — G5601 Carpal tunnel syndrome, right upper limb: Secondary | ICD-10-CM | POA: Diagnosis not present

## 2015-10-22 DIAGNOSIS — G603 Idiopathic progressive neuropathy: Secondary | ICD-10-CM | POA: Diagnosis not present

## 2015-10-22 DIAGNOSIS — G5602 Carpal tunnel syndrome, left upper limb: Secondary | ICD-10-CM | POA: Diagnosis not present

## 2015-10-22 DIAGNOSIS — M5417 Radiculopathy, lumbosacral region: Secondary | ICD-10-CM | POA: Diagnosis not present

## 2015-11-03 DIAGNOSIS — Z9841 Cataract extraction status, right eye: Secondary | ICD-10-CM | POA: Diagnosis not present

## 2015-11-03 DIAGNOSIS — H2512 Age-related nuclear cataract, left eye: Secondary | ICD-10-CM | POA: Insufficient documentation

## 2015-11-03 DIAGNOSIS — Z7982 Long term (current) use of aspirin: Secondary | ICD-10-CM | POA: Diagnosis not present

## 2015-11-03 DIAGNOSIS — F17210 Nicotine dependence, cigarettes, uncomplicated: Secondary | ICD-10-CM | POA: Diagnosis not present

## 2015-11-03 DIAGNOSIS — F419 Anxiety disorder, unspecified: Secondary | ICD-10-CM | POA: Diagnosis not present

## 2015-11-03 DIAGNOSIS — E785 Hyperlipidemia, unspecified: Secondary | ICD-10-CM | POA: Diagnosis not present

## 2015-11-03 DIAGNOSIS — Z79899 Other long term (current) drug therapy: Secondary | ICD-10-CM | POA: Diagnosis not present

## 2015-11-03 DIAGNOSIS — I10 Essential (primary) hypertension: Secondary | ICD-10-CM | POA: Diagnosis not present

## 2015-11-03 DIAGNOSIS — Z961 Presence of intraocular lens: Secondary | ICD-10-CM | POA: Diagnosis not present

## 2015-11-03 DIAGNOSIS — J449 Chronic obstructive pulmonary disease, unspecified: Secondary | ICD-10-CM | POA: Diagnosis not present

## 2015-11-03 DIAGNOSIS — E669 Obesity, unspecified: Secondary | ICD-10-CM | POA: Diagnosis not present

## 2016-01-19 DIAGNOSIS — M5441 Lumbago with sciatica, right side: Secondary | ICD-10-CM | POA: Diagnosis not present

## 2016-01-19 DIAGNOSIS — M5442 Lumbago with sciatica, left side: Secondary | ICD-10-CM | POA: Diagnosis not present

## 2016-01-19 DIAGNOSIS — F5101 Primary insomnia: Secondary | ICD-10-CM | POA: Diagnosis not present

## 2016-01-19 DIAGNOSIS — G250 Essential tremor: Secondary | ICD-10-CM | POA: Diagnosis not present

## 2016-01-19 DIAGNOSIS — M542 Cervicalgia: Secondary | ICD-10-CM | POA: Diagnosis not present

## 2016-01-19 DIAGNOSIS — R201 Hypoesthesia of skin: Secondary | ICD-10-CM | POA: Diagnosis not present

## 2016-01-19 DIAGNOSIS — G603 Idiopathic progressive neuropathy: Secondary | ICD-10-CM | POA: Diagnosis not present

## 2016-02-10 DIAGNOSIS — L570 Actinic keratosis: Secondary | ICD-10-CM | POA: Diagnosis not present

## 2016-02-10 DIAGNOSIS — L820 Inflamed seborrheic keratosis: Secondary | ICD-10-CM | POA: Diagnosis not present

## 2016-02-10 DIAGNOSIS — B351 Tinea unguium: Secondary | ICD-10-CM | POA: Diagnosis not present

## 2016-02-15 DIAGNOSIS — Z23 Encounter for immunization: Secondary | ICD-10-CM | POA: Diagnosis not present

## 2016-02-23 DIAGNOSIS — H903 Sensorineural hearing loss, bilateral: Secondary | ICD-10-CM | POA: Diagnosis not present

## 2016-02-23 DIAGNOSIS — J322 Chronic ethmoidal sinusitis: Secondary | ICD-10-CM | POA: Diagnosis not present

## 2016-02-23 DIAGNOSIS — J320 Chronic maxillary sinusitis: Secondary | ICD-10-CM | POA: Diagnosis not present

## 2016-04-04 DIAGNOSIS — G4733 Obstructive sleep apnea (adult) (pediatric): Secondary | ICD-10-CM | POA: Diagnosis not present

## 2016-04-04 DIAGNOSIS — E6609 Other obesity due to excess calories: Secondary | ICD-10-CM | POA: Diagnosis not present

## 2016-04-04 DIAGNOSIS — F17210 Nicotine dependence, cigarettes, uncomplicated: Secondary | ICD-10-CM | POA: Diagnosis not present

## 2016-04-04 DIAGNOSIS — G4719 Other hypersomnia: Secondary | ICD-10-CM | POA: Diagnosis not present

## 2016-04-04 DIAGNOSIS — Z9989 Dependence on other enabling machines and devices: Secondary | ICD-10-CM | POA: Diagnosis not present

## 2016-04-04 DIAGNOSIS — I10 Essential (primary) hypertension: Secondary | ICD-10-CM | POA: Diagnosis not present

## 2016-04-07 DIAGNOSIS — Z79899 Other long term (current) drug therapy: Secondary | ICD-10-CM | POA: Diagnosis not present

## 2016-04-07 DIAGNOSIS — R202 Paresthesia of skin: Secondary | ICD-10-CM | POA: Diagnosis not present

## 2016-04-09 ENCOUNTER — Emergency Department (HOSPITAL_BASED_OUTPATIENT_CLINIC_OR_DEPARTMENT_OTHER)
Admission: EM | Admit: 2016-04-09 | Discharge: 2016-04-09 | Disposition: A | Discharge status: Home / Self Care | Payer: Medicare Other | Attending: Emergency Medicine | Admitting: Emergency Medicine

## 2016-04-09 ENCOUNTER — Emergency Department (HOSPITAL_BASED_OUTPATIENT_CLINIC_OR_DEPARTMENT_OTHER): Payer: Medicare Other

## 2016-04-09 ENCOUNTER — Encounter (HOSPITAL_BASED_OUTPATIENT_CLINIC_OR_DEPARTMENT_OTHER): Payer: Self-pay | Admitting: Emergency Medicine

## 2016-04-09 DIAGNOSIS — Z7951 Long term (current) use of inhaled steroids: Secondary | ICD-10-CM | POA: Diagnosis not present

## 2016-04-09 DIAGNOSIS — S4992XA Unspecified injury of left shoulder and upper arm, initial encounter: Secondary | ICD-10-CM | POA: Diagnosis not present

## 2016-04-09 DIAGNOSIS — W1839XA Other fall on same level, initial encounter: Secondary | ICD-10-CM | POA: Diagnosis not present

## 2016-04-09 DIAGNOSIS — S299XXA Unspecified injury of thorax, initial encounter: Secondary | ICD-10-CM | POA: Diagnosis not present

## 2016-04-09 DIAGNOSIS — Z79899 Other long term (current) drug therapy: Secondary | ICD-10-CM | POA: Diagnosis not present

## 2016-04-09 DIAGNOSIS — Y939 Activity, unspecified: Secondary | ICD-10-CM | POA: Insufficient documentation

## 2016-04-09 DIAGNOSIS — F17210 Nicotine dependence, cigarettes, uncomplicated: Secondary | ICD-10-CM | POA: Diagnosis not present

## 2016-04-09 DIAGNOSIS — Y92007 Garden or yard of unspecified non-institutional (private) residence as the place of occurrence of the external cause: Secondary | ICD-10-CM | POA: Diagnosis not present

## 2016-04-09 DIAGNOSIS — Z7982 Long term (current) use of aspirin: Secondary | ICD-10-CM | POA: Diagnosis not present

## 2016-04-09 DIAGNOSIS — S2232XA Fracture of one rib, left side, initial encounter for closed fracture: Secondary | ICD-10-CM

## 2016-04-09 DIAGNOSIS — M25512 Pain in left shoulder: Secondary | ICD-10-CM | POA: Diagnosis not present

## 2016-04-09 DIAGNOSIS — R0781 Pleurodynia: Secondary | ICD-10-CM | POA: Diagnosis not present

## 2016-04-09 DIAGNOSIS — Y999 Unspecified external cause status: Secondary | ICD-10-CM | POA: Diagnosis not present

## 2016-04-09 DIAGNOSIS — Z791 Long term (current) use of non-steroidal anti-inflammatories (NSAID): Secondary | ICD-10-CM | POA: Diagnosis not present

## 2016-04-09 MED ORDER — HYDROCODONE-ACETAMINOPHEN 5-325 MG PO TABS: 1.0000 | ORAL_TABLET | ORAL | 0 refills | Status: DC | PRN

## 2016-04-09 NOTE — ED Notes (Signed)
Patient transported to X-ray 

## 2016-04-09 NOTE — ED Triage Notes (Signed)
Pt reports left shoulder pain onset x1 day reported fall over garden hose 1 week ago that caused left rib pain and pain has now moved up to left shoulder. Pain increases with movement

## 2016-04-09 NOTE — ED Provider Notes (Signed)
[place] DEPT [facility] Provider Note   CSN: [id number] Arrival date & time: 04/09/16  0640     History   Chief Complaint Chief Complaint  Patient presents with  . Shoulder Pain    HPI [name] [name] is a 70 y.o. female.  Patient fell over a garden hose approximately one week ago. She landed on her left side especially her ribs. She developed some soreness in her shoulder and her posterior ribs.  In the last day or so however the symptoms seem to have increased. It hurts to palpate the anterior and posterior rib area. Whenever she sneezes she has more sharp pain in her chest. He also has some soreness in the back of her left shoulder. She denies any fevers or chills. No coughing. No nausea or diaphoresis.   The history is provided by the patient.  Shoulder Pain   This is a new problem. The current episode started more than 1 week ago. The problem occurs constantly. The pain is present in the left shoulder (posterior and anterior left ribs). The pain is moderate. Associated symptoms include limited range of motion and stiffness. The symptoms are aggravated by contact. The treatment provided no relief.    Past Medical History:  Diagnosis Date  . Depression   . Deviated septum   . Diverticulitis 2014  . Hearing loss   . Sinus drainage   . Sleep apnea     Patient Active Problem List   Diagnosis Date Noted  . Transaminitis 11/19/2012  . Diverticulitis 11/14/2012    Past Surgical History:  Procedure Laterality Date  . APPENDECTOMY    . BACK SURGERY    . BREAST SURGERY    . CESAREAN SECTION    . EYE SURGERY    . FOOT SURGERY    . FOOT SURGERY    . FOOT SURGERY Left   . NECK SURGERY      OB History    No data available       Home Medications    Prior to Admission medications   Medication Sig Start Date End Date Taking? Authorizing Provider  albuterol (PROVENTIL HFA;VENTOLIN HFA) 108 (90 BASE) MCG/ACT inhaler Inhale 2 puffs into the lungs every 6 (six)  hours as needed for wheezing.   Yes Historical Provider, MD  alendronate (FOSAMAX) 70 MG tablet Take 70 mg by mouth every 30 (thirty) days. Take with a full glass of water on an empty stomach on Sundays    Yes Historical Provider, MD  ALPRAZolam Duanne Moron) 0.5 MG tablet Take 0.5 mg by mouth 2 (two) times daily.   Yes Historical Provider, MD  Armodafinil (NUVIGIL) 250 MG tablet Take 250 mg by mouth daily.   Yes Historical Provider, MD  aspirin 81 MG tablet Take 81 mg by mouth every morning. Take an additional tablet if needed for leg cramps in the evening   Yes Historical Provider, MD  atorvastatin (LIPITOR) 10 MG tablet Take 10 mg by mouth daily.   Yes Historical Provider, MD  Bilberry 1000 MG CAPS Take 1 tablet by mouth every morning.    Yes Historical Provider, MD  budesonide-formoterol (SYMBICORT) 160-4.5 MCG/ACT inhaler Inhale 2 puffs into the lungs daily as needed (for wheezing).   Yes Historical Provider, MD  buPROPion (WELLBUTRIN SR) 150 MG 12 hr tablet Take 150 mg by mouth 2 (two) times daily.   Yes Historical Provider, MD  cholecalciferol (VITAMIN D) 1000 UNITS tablet Take 1,000 Units by mouth every morning.   Yes Historical  Provider, MD  DULoxetine (CYMBALTA) 60 MG capsule Take 60 mg by mouth daily.   Yes Historical Provider, MD  fluticasone (FLONASE) 50 MCG/ACT nasal spray Place 1 spray into both nostrils daily.   Yes Historical Provider, MD  furosemide (LASIX) 20 MG tablet Take 20 mg by mouth.   Yes Historical Provider, MD  Magnesium 500 MG TABS Take 1 tablet by mouth every morning.    Yes Historical Provider, MD  Multiple Vitamin (MULTIVITAMIN WITH MINERALS) TABS Take 1 tablet by mouth every morning.   Yes Historical Provider, MD  OXYCODONE HCL PO Take by mouth.   Yes Historical Provider, MD  pantoprazole (PROTONIX) 40 MG tablet Take 1 tablet (40 mg total) by mouth daily. 11/21/12  Yes Emina Riebock, NP  potassium chloride (K-DUR,KLOR-CON) 10 MEQ tablet Take 10 mEq by mouth 2 (two) times  daily.   Yes Historical Provider, MD  sodium chloride (MURO 128) 2 % ophthalmic solution Place 1 drop into both eyes every morning.   Yes Historical Provider, MD  solifenacin (VESICARE) 10 MG tablet Take by mouth daily.   Yes Historical Provider, MD  tiZANidine (ZANAFLEX) 4 MG tablet Take 4 mg by mouth every 6 (six) hours as needed for muscle spasms.   Yes Historical Provider, MD  traMADol (ULTRAM) 50 MG tablet Take 50 mg by mouth every 8 (eight) hours as needed for pain.    Yes Historical Provider, MD  triamterene-hydrochlorothiazide (DYAZIDE) 37.5-25 MG per capsule Take 1 capsule by mouth daily.   Yes Historical Provider, MD  vitamin B-12 (CYANOCOBALAMIN) 1000 MCG tablet Take 1,000 mcg by mouth every morning.    Yes Historical Provider, MD  HYDROcodone-acetaminophen (NORCO/VICODIN) 5-325 MG per tablet Take 1 tablet by mouth every 6 (six) hours as needed for moderate pain.    Historical Provider, MD  tolterodine (DETROL) 2 MG tablet Take 2 mg by mouth 2 (two) times daily.    Historical Provider, MD    Family History Family History  Problem Relation Age of Onset  . Kidney failure Mother     Social History Social History  Substance Use Topics  . Smoking status: Current Every Day Smoker    Types: Cigarettes  . Smokeless tobacco: Never Used  . Alcohol use No     Allergies   Doxycycline; Gabapentin; Other; and Tape   Review of Systems Review of Systems  Musculoskeletal: Positive for stiffness.  All other systems reviewed and are negative.    Physical Exam Updated Vital Signs BP 117/92   Pulse 89   Temp 98.8 F (37.1 C) (Oral)   Resp 23   Ht 5\' 2"  (1.575 m)   Wt 86.6 kg   SpO2 99%   BMI 34.93 kg/m   Physical Exam  Constitutional: She appears well-developed and well-nourished. No distress.  HENT:  Head: Normocephalic and atraumatic.  Right Ear: External ear normal.  Left Ear: External ear normal.  Eyes: Conjunctivae are normal. Right eye exhibits no discharge. Left  eye exhibits no discharge. No scleral icterus.  Neck: Neck supple. No tracheal deviation present.  Cardiovascular: Normal rate.   Pulmonary/Chest: Effort normal. No stridor. No respiratory distress. She exhibits tenderness (Anterior and posterior mid to lower ribs on the left side).  Abdominal: She exhibits no distension.  Musculoskeletal: She exhibits no edema.       Left shoulder: She exhibits tenderness. She exhibits normal range of motion, no deformity, no spasm and normal pulse.       Left elbow: Normal.  Left upper arm: Normal.  Neurological: She is alert. Cranial nerve deficit: no gross deficits.  Skin: Skin is warm and dry. No rash noted. She is not diaphoretic.  Psychiatric: She has a normal mood and affect.  Nursing note and vitals reviewed.    ED Treatments / Results  Labs (all labs ordered are listed, but only abnormal results are displayed) Labs Reviewed - No data to display  EKG  EKG Interpretation  Date/Time:  Sunday April 09 2016 06:58:43 EDT Ventricular Rate:  93 PR Interval:    QRS Duration: 92 QT Interval:  377 QTC Calculation: 469 R Axis:   89 Text Interpretation:  Sinus rhythm Borderline right axis deviation Low voltage, precordial leads No significant change since last tracing Confirmed by Tomeka Kantner  MD-J, Chritopher Coster ([id number]) on 04/09/2016 7:03:53 AM       Radiology Dg Ribs Unilateral W/chest Left  Result Date: 04/09/2016 CLINICAL DATA:  [place] 1 week ago with left-sided chest pain. EXAM: LEFT RIBS AND CHEST - 3+ VIEW COMPARISON:  10/23/2013 FINDINGS: Heart size is normal. Mediastinal shadows normal except for calcification of the thoracic aorta. There may be minimal atelectasis in the lingula. No pneumothorax or hemothorax. I think there is a nondisplaced fracture of the left seventh rib anteriorly. IMPRESSION: Mild atelectasis in the lingula. Nondisplaced fracture of the left seventh rib anteriorly. Electronically Signed   By: Nelson Chimes M.D.   On: 04/09/2016  07:45   Dg Shoulder Left  Result Date: 04/09/2016 CLINICAL DATA:  [place] 1 week ago.  Left shoulder region pain. EXAM: LEFT SHOULDER - 2+ VIEW COMPARISON:  01/24/2013 FINDINGS: Glenohumeral joint is normal. No evidence of scapular fracture. Subacromial spur or hook could predispose to rotator cuff disease. IMPRESSION: No acute finding. Electronically Signed   By: Nelson Chimes M.D.   On: 04/09/2016 07:47    Procedures Procedures (including critical care time)  Medications Ordered in ED Medications - No data to display   Initial Impression / Assessment and Plan / ED Course  I have reviewed the triage vital signs and the nursing notes.  Pertinent labs & imaging results that were available during my care of the patient were reviewed by me and considered in my medical decision making (see chart for details).  Clinical Course  Value Comment By Time  DG Ribs Unilateral W/Chest Left (Reviewed) Dorie Rank, MD 10/08 0802    Xray shows a rib fx.  Discussed findings with patient.  She has tried ultram and ibuprofen at home without relief.  She does have hydrocodone at home for her neuropathy but has not tried that.  Instructed her to take that.  She can use OTC meds as well but be careful about tylenol intake.   Final Clinical Impressions(s) / ED Diagnoses   Final diagnoses:  Closed fracture of one rib of left side, initial encounter    New Prescriptions Current Discharge Medication List       Dorie Rank, MD 04/09/16 [phone]

## 2016-04-09 NOTE — Discharge Instructions (Signed)
Take the hydrocodone pain medication that you have at home.  It will take a few weeks for the rib to heal.  Follow up with your doctor In a week or so to be rechecked

## 2016-04-18 DIAGNOSIS — M542 Cervicalgia: Secondary | ICD-10-CM | POA: Diagnosis not present

## 2016-04-18 DIAGNOSIS — M5441 Lumbago with sciatica, right side: Secondary | ICD-10-CM | POA: Diagnosis not present

## 2016-04-18 DIAGNOSIS — M5442 Lumbago with sciatica, left side: Secondary | ICD-10-CM | POA: Diagnosis not present

## 2016-04-18 DIAGNOSIS — R32 Unspecified urinary incontinence: Secondary | ICD-10-CM | POA: Diagnosis not present

## 2016-04-18 DIAGNOSIS — F5101 Primary insomnia: Secondary | ICD-10-CM | POA: Diagnosis not present

## 2016-04-18 DIAGNOSIS — G250 Essential tremor: Secondary | ICD-10-CM | POA: Diagnosis not present

## 2016-04-23 ENCOUNTER — Emergency Department (HOSPITAL_BASED_OUTPATIENT_CLINIC_OR_DEPARTMENT_OTHER): Payer: Medicare Other

## 2016-04-23 ENCOUNTER — Emergency Department (HOSPITAL_BASED_OUTPATIENT_CLINIC_OR_DEPARTMENT_OTHER)
Admission: EM | Admit: 2016-04-23 | Discharge: 2016-04-23 | Disposition: A | Discharge status: Home / Self Care | Payer: Medicare Other | Attending: Emergency Medicine | Admitting: Emergency Medicine

## 2016-04-23 ENCOUNTER — Encounter (HOSPITAL_BASED_OUTPATIENT_CLINIC_OR_DEPARTMENT_OTHER): Payer: Self-pay | Admitting: *Deleted

## 2016-04-23 DIAGNOSIS — F17210 Nicotine dependence, cigarettes, uncomplicated: Secondary | ICD-10-CM | POA: Insufficient documentation

## 2016-04-23 DIAGNOSIS — R079 Chest pain, unspecified: Secondary | ICD-10-CM | POA: Diagnosis not present

## 2016-04-23 DIAGNOSIS — W109XXA Fall (on) (from) unspecified stairs and steps, initial encounter: Secondary | ICD-10-CM | POA: Insufficient documentation

## 2016-04-23 DIAGNOSIS — M25511 Pain in right shoulder: Secondary | ICD-10-CM | POA: Insufficient documentation

## 2016-04-23 DIAGNOSIS — Y999 Unspecified external cause status: Secondary | ICD-10-CM | POA: Insufficient documentation

## 2016-04-23 DIAGNOSIS — Y9301 Activity, walking, marching and hiking: Secondary | ICD-10-CM | POA: Insufficient documentation

## 2016-04-23 DIAGNOSIS — Y92009 Unspecified place in unspecified non-institutional (private) residence as the place of occurrence of the external cause: Secondary | ICD-10-CM | POA: Diagnosis not present

## 2016-04-23 DIAGNOSIS — W19XXXA Unspecified fall, initial encounter: Secondary | ICD-10-CM

## 2016-04-23 DIAGNOSIS — S4991XA Unspecified injury of right shoulder and upper arm, initial encounter: Secondary | ICD-10-CM | POA: Diagnosis not present

## 2016-04-23 DIAGNOSIS — R296 Repeated falls: Secondary | ICD-10-CM | POA: Diagnosis not present

## 2016-04-23 MED ORDER — DOCUSATE SODIUM 100 MG PO CAPS: 100.0000 mg | ORAL_CAPSULE | Freq: Two times a day (BID) | ORAL | 0 refills | Status: AC

## 2016-04-23 MED ORDER — HYDROCODONE-ACETAMINOPHEN 5-325 MG PO TABS: 1.0000 | ORAL_TABLET | Freq: Four times a day (QID) | ORAL | 0 refills | Status: DC | PRN

## 2016-04-23 MED ORDER — LIDOCAINE 5 % EX PTCH: 1.0000 | MEDICATED_PATCH | CUTANEOUS | 0 refills | Status: AC

## 2016-04-23 NOTE — ED Notes (Signed)
EDP into room 

## 2016-04-23 NOTE — Discharge Instructions (Signed)

## 2016-04-23 NOTE — ED Notes (Signed)
Pt pacing in room, standing in doorway, NAD, calm, alert, steady on feet.

## 2016-04-23 NOTE — ED Provider Notes (Signed)
Emergency Department Provider Note   I have reviewed the triage vital signs and the nursing notes.   HISTORY  Chief Complaint Fall   HPI [name] [name] is a 70 y.o. female with PMH of depression, hearing loss, and frequent recent falls presents to the emergency department for evaluation of right shoulder and chest pain after fall. Patient estimates the most recent fall was 3 weeks prior to this ED presentation. Seen around that same time for left-sided chest pain and found to have a broken rib. She notes this most recent fall shortly after that when she was walking down steps and "missed the bottom step" causing her to fall. She denies any head trauma or loss of consciousness. He states that she was previously prescribed hydrocodone but decided not to take it because she felt there was "more Tylenol in it than anything else." She reports that because the number beside the Tylenol was larger than the hydrocodone she fell that there was no use in taking this medication.   She's continue to have pain for last 3 weeks. She denies fever or shaking chills. No difficulty breathing. She does have pain with movement of the right shoulder. She has some chest discomfort along with the shoulder pain that is also worse with movement. The pain is not exertional or pleuritic. She lives alone and drove herself here to the emergency department. Her son calls to check on her several times a day and sometimes comes by the house.    Past Medical History:  Diagnosis Date  . Depression   . Deviated septum   . Diverticulitis 2014  . Hearing loss   . Sinus drainage   . Sleep apnea     Patient Active Problem List   Diagnosis Date Noted  . Transaminitis 11/19/2012  . Diverticulitis 11/14/2012    Past Surgical History:  Procedure Laterality Date  . APPENDECTOMY    . BACK SURGERY    . BREAST SURGERY    . CESAREAN SECTION    . EYE SURGERY    . FOOT SURGERY    . FOOT SURGERY    . FOOT SURGERY  Left   . NECK SURGERY      Current Outpatient Rx  . Order #: [id number] Class: Historical Med  . Order #: [id number] Class: Historical Med  . Order #: [id number] Class: Historical Med  . Order #: [id number] Class: Historical Med  . Order #: [id number] Class: Historical Med  . Order #: [id number] Class: Historical Med  . Order #: [id number] Class: Historical Med  . Order #: [id number] Class: Historical Med  . Order #: [id number] Class: Historical Med  . Order #: [id number] Class: Historical Med  . Order #: [id number] Class: Print  . Order #: [id number] Class: Historical Med  . Order #: [id number] Class: Historical Med  . Order #: [id number] Class: Historical Med  . Order #: [id number] Class: Print  . Order #: [id number] Class: Print  . Order #: [id number] Class: Historical Med  . Order #: [id number] Class: Historical Med  . Order #: [id number] Class: Historical Med  . Order #: [id number] Class: Normal  . Order #: [id number] Class: Historical Med  . Order #: [id number] Class: Historical Med  . Order #: [id number] Class: Historical Med  . Order #: [id number] Class: Historical Med  . Order #: [id number] Class: Historical Med  . Order #: [id number] Class: Historical Med  . Order #: [id number] Class: Historical Med  . Order #: [id number] Class: Historical Med    Allergies Doxycycline; Gabapentin; Other; and Tape  Family History  Problem Relation Age of Onset  . Kidney failure Mother  Social History Social History  Substance Use Topics  . Smoking status: Current Every Day Smoker    Types: Cigarettes  . Smokeless tobacco: Never Used  . Alcohol use No    Review of Systems  Constitutional: No fever/chills Eyes: No visual changes. ENT: No sore throat. Cardiovascular: Denies chest pain. Respiratory: Denies shortness of breath. Gastrointestinal: No abdominal pain.  No nausea, no vomiting.  No diarrhea.  No constipation. Genitourinary: Negative for dysuria. Musculoskeletal: Negative for back pain. Positive right shoulder and chest  pain.  Skin: Negative for rash. Neurological: Negative for headaches, focal weakness or numbness.  10-point ROS otherwise negative.  ____________________________________________   PHYSICAL EXAM:  VITAL SIGNS: ED Triage Vitals  Enc Vitals Group     BP 04/23/16 0546 169/93     Pulse Rate 04/23/16 0546 76     Resp 04/23/16 0546 18     Temp 04/23/16 0546 98.1 F (36.7 C)     Temp Source 04/23/16 0546 Oral     SpO2 04/23/16 0546 97 %     Weight 04/23/16 0547 191 lb (86.6 kg)     Height 04/23/16 0547 5\' 2"  (1.575 m)     Pain Score 04/23/16 0547 10   Constitutional: Alert but hard of hearing. Well appearing and in no acute distress. Eyes: Conjunctivae are normal. PERRL. EOMI. Head: Atraumatic. Nose: No congestion/rhinnorhea. Mouth/Throat: Mucous membranes are moist.  Oropharynx non-erythematous. Neck: No stridor.  No cervical spine tenderness to palpation. Cardiovascular: Normal rate, regular rhythm. Good peripheral circulation. Grossly normal heart sounds.   Respiratory: Normal respiratory effort.  No retractions. Lungs CTAB. Gastrointestinal: Soft and nontender. No distention.  Musculoskeletal: No lower extremity tenderness nor edema. No gross deformities of extremities. Right shoulder pain worse with movement. Some tenderness over right clavicle as well. Normal ROM of the left shoulder, wrist, and elbow.  Neurologic:  Normal speech and language. No gross focal neurologic deficits are appreciated.  Skin:  Skin is warm, dry and intact. No rash noted.  ____________________________________________  RADIOLOGY  Dg Chest 2 View  Result Date: 04/23/2016 CLINICAL DATA:  Recent fall with chest pain, initial encounter EXAM: CHEST  2 VIEW COMPARISON:  04/09/2016 FINDINGS: Cardiac shadow is within normal limits. The lungs are well aerated bilaterally. No focal infiltrate or sizable effusion is seen. No acute bony abnormality is noted. IMPRESSION: No acute abnormality seen. Electronically  Signed   By: Inez Catalina M.D.   On: 04/23/2016 07:35   Dg Shoulder Right  Result Date: 04/23/2016 CLINICAL DATA:  Recent falls with right shoulder pain, initial encounter EXAM: RIGHT SHOULDER - 2+ VIEW COMPARISON:  None. FINDINGS: Mild degenerative changes of the acromioclavicular joint are seen. No acute fracture dislocation is noted. The underlying bony thorax is within normal limits. IMPRESSION: No acute abnormality noted. Electronically Signed   By: Inez Catalina M.D.   On: 04/23/2016 07:28   Ct Head Wo Contrast  Result Date: 04/23/2016 CLINICAL DATA:  Recent falls EXAM: CT HEAD WITHOUT CONTRAST TECHNIQUE: Contiguous axial images were obtained from the base of the skull through the vertex without intravenous contrast. COMPARISON:  None. FINDINGS: Brain: No evidence of acute infarction, hemorrhage, hydrocephalus, extra-axial collection or mass lesion/mass effect. Mild atrophic changes are noted. Vascular: No hyperdense vessel or unexpected calcification. Skull: Normal. Negative for fracture or focal lesion. Sinuses/Orbits: No acute finding. Other: None. IMPRESSION: Atrophic changes without acute abnormality. Electronically Signed   By: Inez Catalina M.D.   On: 04/23/2016 07:21    ____________________________________________  PROCEDURES  Procedure(s) performed:   Procedures  None ____________________________________________   INITIAL IMPRESSION / ASSESSMENT AND PLAN / ED COURSE  Pertinent labs & imaging results that were available during my care of the patient were reviewed by me and considered in my medical decision making (see chart for details).  Patient resents the emergency department for evaluation of right shoulder pain and chest wall pain. She had a fall 3 weeks ago and has been not adequately controlling her pain at home which prompted her ED visit today. I discussed with her the risks and benefits of taking hydrocodone. She has not tried hydrocodone for this episode of pain  nor did she start taking it for the rib fracture found on 04/09/16. No clinical signs or symptoms to suggest underlying pneumonia. She has no bruising of the right lateral chest wall, no flail segment, but does have some tenderness to palpation. No evidence of head trauma. Given frequent remote falls will obtain repeat CT scan of the head to sure the patient has not developed an acute on chronic subdural hematoma which is leading to further falls. She is not anticoagulated.   08:00 AM Patient with normal plain films of the shoulder and chest. CT scan shows chronic changes but nothing acute to explain the patient's falls. Her last fall was 3 weeks ago. She does have some mild confusion on my exam but seems overall appropriate. He lives alone. I believe she could benefit from home health evaluation for PT/OT/Nursing Aid, and Social Work. I have placed an order in the discharge section for [place]. The patient is in agreement with the plan at discharge.   At this time, I do not feel there is any life-threatening condition present. I have reviewed and discussed all results (EKG, imaging, lab, urine as appropriate), exam findings with patient. I have reviewed nursing notes and appropriate previous records.  I feel the patient is safe to be discharged home without further emergent workup. Discussed usual and customary return precautions. Patient and family (if present) verbalize understanding and are comfortable with this plan.  Patient will follow-up with their primary care provider. If they do not have a primary care provider, information for follow-up has been provided to them. All questions have been answered.  ____________________________________________  FINAL CLINICAL IMPRESSION(S) / ED DIAGNOSES  Final diagnoses:  Fall, initial encounter  Right shoulder pain, unspecified chronicity     MEDICATIONS GIVEN DURING THIS VISIT:  None  NEW OUTPATIENT MEDICATIONS STARTED DURING THIS  VISIT:  New Prescriptions   DOCUSATE SODIUM (COLACE) 100 MG CAPSULE    Take 1 capsule (100 mg total) by mouth every 12 (twelve) hours.   HYDROCODONE-ACETAMINOPHEN (NORCO/VICODIN) 5-325 MG TABLET    Take 1 tablet by mouth every 6 (six) hours as needed for severe pain.   LIDOCAINE (LIDODERM) 5 %    Place 1 patch onto the skin daily. Remove & Discard patch within 12 hours or as directed by MD      Note:  This document was prepared using Dragon voice recognition software and may include unintentional dictation errors.  Nanda Quinton, MD Emergency Medicine   Margette Fast, MD 04/23/16 [phone]

## 2016-04-23 NOTE — ED Notes (Signed)
Pt not in room, pt in xray/CT.

## 2016-04-23 NOTE — ED Triage Notes (Signed)
Seen on 10/8 for recent fall. Returns tonight after another fall. Relates to forgetting cane and peripheral neuropathy, also tripped coming down stairs (missed step), c/o R side, clavicle, shoulder and breast pain, also some back and rib pain from previous fall, denies LOC or hitting head, no meds PTA, alert, NAD, calm, interactive, resps e/u, speaking in clear complete sentences, VSS.

## 2016-06-02 DIAGNOSIS — J0100 Acute maxillary sinusitis, unspecified: Secondary | ICD-10-CM | POA: Diagnosis not present

## 2016-06-02 DIAGNOSIS — J320 Chronic maxillary sinusitis: Secondary | ICD-10-CM | POA: Diagnosis not present

## 2016-06-02 DIAGNOSIS — J0120 Acute ethmoidal sinusitis, unspecified: Secondary | ICD-10-CM | POA: Diagnosis not present

## 2016-06-02 DIAGNOSIS — J322 Chronic ethmoidal sinusitis: Secondary | ICD-10-CM | POA: Diagnosis not present

## 2016-07-18 DIAGNOSIS — M5412 Radiculopathy, cervical region: Secondary | ICD-10-CM | POA: Diagnosis not present

## 2016-07-18 DIAGNOSIS — G4733 Obstructive sleep apnea (adult) (pediatric): Secondary | ICD-10-CM | POA: Diagnosis not present

## 2016-07-18 DIAGNOSIS — G603 Idiopathic progressive neuropathy: Secondary | ICD-10-CM | POA: Diagnosis not present

## 2016-07-18 DIAGNOSIS — Z79899 Other long term (current) drug therapy: Secondary | ICD-10-CM | POA: Diagnosis not present

## 2016-07-18 DIAGNOSIS — R634 Abnormal weight loss: Secondary | ICD-10-CM | POA: Diagnosis not present

## 2016-07-18 DIAGNOSIS — R32 Unspecified urinary incontinence: Secondary | ICD-10-CM | POA: Diagnosis not present

## 2016-07-18 DIAGNOSIS — G609 Hereditary and idiopathic neuropathy, unspecified: Secondary | ICD-10-CM | POA: Diagnosis not present

## 2016-07-18 DIAGNOSIS — M5417 Radiculopathy, lumbosacral region: Secondary | ICD-10-CM | POA: Diagnosis not present

## 2016-07-18 DIAGNOSIS — G250 Essential tremor: Secondary | ICD-10-CM | POA: Diagnosis not present

## 2016-07-18 DIAGNOSIS — R202 Paresthesia of skin: Secondary | ICD-10-CM | POA: Diagnosis not present

## 2016-07-18 DIAGNOSIS — G5603 Carpal tunnel syndrome, bilateral upper limbs: Secondary | ICD-10-CM | POA: Diagnosis not present

## 2016-10-18 DIAGNOSIS — M5441 Lumbago with sciatica, right side: Secondary | ICD-10-CM | POA: Diagnosis not present

## 2016-10-18 DIAGNOSIS — R42 Dizziness and giddiness: Secondary | ICD-10-CM | POA: Diagnosis not present

## 2016-10-18 DIAGNOSIS — E538 Deficiency of other specified B group vitamins: Secondary | ICD-10-CM | POA: Diagnosis not present

## 2016-10-18 DIAGNOSIS — G250 Essential tremor: Secondary | ICD-10-CM | POA: Diagnosis not present

## 2016-10-18 DIAGNOSIS — R202 Paresthesia of skin: Secondary | ICD-10-CM | POA: Diagnosis not present

## 2016-10-18 DIAGNOSIS — M542 Cervicalgia: Secondary | ICD-10-CM | POA: Diagnosis not present

## 2016-10-18 DIAGNOSIS — R5383 Other fatigue: Secondary | ICD-10-CM | POA: Diagnosis not present

## 2016-10-18 DIAGNOSIS — R634 Abnormal weight loss: Secondary | ICD-10-CM | POA: Diagnosis not present

## 2016-10-18 DIAGNOSIS — Z79899 Other long term (current) drug therapy: Secondary | ICD-10-CM | POA: Diagnosis not present

## 2016-10-18 DIAGNOSIS — M5442 Lumbago with sciatica, left side: Secondary | ICD-10-CM | POA: Diagnosis not present

## 2016-10-18 DIAGNOSIS — G603 Idiopathic progressive neuropathy: Secondary | ICD-10-CM | POA: Diagnosis not present

## 2016-10-18 DIAGNOSIS — G5603 Carpal tunnel syndrome, bilateral upper limbs: Secondary | ICD-10-CM | POA: Diagnosis not present

## 2016-12-07 DIAGNOSIS — M7731 Calcaneal spur, right foot: Secondary | ICD-10-CM | POA: Diagnosis not present

## 2016-12-07 DIAGNOSIS — M722 Plantar fascial fibromatosis: Secondary | ICD-10-CM | POA: Diagnosis not present

## 2016-12-13 DIAGNOSIS — H3589 Other specified retinal disorders: Secondary | ICD-10-CM | POA: Diagnosis not present

## 2016-12-13 DIAGNOSIS — H1851 Endothelial corneal dystrophy: Secondary | ICD-10-CM | POA: Diagnosis not present

## 2016-12-14 DIAGNOSIS — Z9989 Dependence on other enabling machines and devices: Secondary | ICD-10-CM | POA: Diagnosis not present

## 2016-12-14 DIAGNOSIS — G4719 Other hypersomnia: Secondary | ICD-10-CM | POA: Diagnosis not present

## 2016-12-14 DIAGNOSIS — Z6832 Body mass index (BMI) 32.0-32.9, adult: Secondary | ICD-10-CM | POA: Diagnosis not present

## 2016-12-14 DIAGNOSIS — G4733 Obstructive sleep apnea (adult) (pediatric): Secondary | ICD-10-CM | POA: Diagnosis not present

## 2016-12-14 DIAGNOSIS — I10 Essential (primary) hypertension: Secondary | ICD-10-CM | POA: Diagnosis not present

## 2016-12-26 DIAGNOSIS — M722 Plantar fascial fibromatosis: Secondary | ICD-10-CM | POA: Diagnosis not present

## 2016-12-26 DIAGNOSIS — M7731 Calcaneal spur, right foot: Secondary | ICD-10-CM | POA: Diagnosis not present

## 2016-12-26 DIAGNOSIS — I10 Essential (primary) hypertension: Secondary | ICD-10-CM | POA: Diagnosis not present

## 2016-12-29 DIAGNOSIS — H353132 Nonexudative age-related macular degeneration, bilateral, intermediate dry stage: Secondary | ICD-10-CM | POA: Diagnosis not present

## 2017-01-10 DIAGNOSIS — H1851 Endothelial corneal dystrophy: Secondary | ICD-10-CM | POA: Diagnosis not present

## 2017-01-10 DIAGNOSIS — H04123 Dry eye syndrome of bilateral lacrimal glands: Secondary | ICD-10-CM | POA: Diagnosis not present

## 2017-01-10 DIAGNOSIS — H3589 Other specified retinal disorders: Secondary | ICD-10-CM | POA: Diagnosis not present

## 2017-01-17 DIAGNOSIS — G4733 Obstructive sleep apnea (adult) (pediatric): Secondary | ICD-10-CM | POA: Diagnosis not present

## 2017-01-17 DIAGNOSIS — M542 Cervicalgia: Secondary | ICD-10-CM | POA: Diagnosis not present

## 2017-01-17 DIAGNOSIS — R32 Unspecified urinary incontinence: Secondary | ICD-10-CM | POA: Diagnosis not present

## 2017-01-17 DIAGNOSIS — M5442 Lumbago with sciatica, left side: Secondary | ICD-10-CM | POA: Diagnosis not present

## 2017-01-17 DIAGNOSIS — G603 Idiopathic progressive neuropathy: Secondary | ICD-10-CM | POA: Diagnosis not present

## 2017-01-17 DIAGNOSIS — M5441 Lumbago with sciatica, right side: Secondary | ICD-10-CM | POA: Diagnosis not present

## 2017-01-17 DIAGNOSIS — G5603 Carpal tunnel syndrome, bilateral upper limbs: Secondary | ICD-10-CM | POA: Diagnosis not present

## 2017-03-01 DIAGNOSIS — M8589 Other specified disorders of bone density and structure, multiple sites: Secondary | ICD-10-CM | POA: Diagnosis not present

## 2017-03-01 DIAGNOSIS — Z1231 Encounter for screening mammogram for malignant neoplasm of breast: Secondary | ICD-10-CM | POA: Diagnosis not present

## 2017-03-01 DIAGNOSIS — M810 Age-related osteoporosis without current pathological fracture: Secondary | ICD-10-CM | POA: Diagnosis not present

## 2017-03-01 DIAGNOSIS — M85851 Other specified disorders of bone density and structure, right thigh: Secondary | ICD-10-CM | POA: Diagnosis not present

## 2017-03-14 DIAGNOSIS — N6322 Unspecified lump in the left breast, upper inner quadrant: Secondary | ICD-10-CM | POA: Diagnosis not present

## 2017-03-14 DIAGNOSIS — R928 Other abnormal and inconclusive findings on diagnostic imaging of breast: Secondary | ICD-10-CM | POA: Diagnosis not present

## 2017-03-14 DIAGNOSIS — N6489 Other specified disorders of breast: Secondary | ICD-10-CM | POA: Diagnosis not present

## 2017-04-17 DIAGNOSIS — C44629 Squamous cell carcinoma of skin of left upper limb, including shoulder: Secondary | ICD-10-CM | POA: Diagnosis not present

## 2017-04-17 DIAGNOSIS — L659 Nonscarring hair loss, unspecified: Secondary | ICD-10-CM | POA: Diagnosis not present

## 2017-04-17 DIAGNOSIS — D485 Neoplasm of uncertain behavior of skin: Secondary | ICD-10-CM | POA: Diagnosis not present

## 2017-04-17 DIAGNOSIS — L814 Other melanin hyperpigmentation: Secondary | ICD-10-CM | POA: Diagnosis not present

## 2017-04-17 DIAGNOSIS — L570 Actinic keratosis: Secondary | ICD-10-CM | POA: Diagnosis not present

## 2017-04-24 DIAGNOSIS — G250 Essential tremor: Secondary | ICD-10-CM | POA: Diagnosis not present

## 2017-04-24 DIAGNOSIS — G603 Idiopathic progressive neuropathy: Secondary | ICD-10-CM | POA: Diagnosis not present

## 2017-04-24 DIAGNOSIS — F5101 Primary insomnia: Secondary | ICD-10-CM | POA: Diagnosis not present

## 2017-04-24 DIAGNOSIS — M5441 Lumbago with sciatica, right side: Secondary | ICD-10-CM | POA: Diagnosis not present

## 2017-04-24 DIAGNOSIS — M542 Cervicalgia: Secondary | ICD-10-CM | POA: Diagnosis not present

## 2017-04-24 DIAGNOSIS — G5603 Carpal tunnel syndrome, bilateral upper limbs: Secondary | ICD-10-CM | POA: Diagnosis not present

## 2017-04-24 DIAGNOSIS — M5442 Lumbago with sciatica, left side: Secondary | ICD-10-CM | POA: Diagnosis not present

## 2017-05-08 DIAGNOSIS — C44629 Squamous cell carcinoma of skin of left upper limb, including shoulder: Secondary | ICD-10-CM | POA: Diagnosis not present

## 2017-05-22 DIAGNOSIS — I8310 Varicose veins of unspecified lower extremity with inflammation: Secondary | ICD-10-CM | POA: Diagnosis not present

## 2017-06-15 DIAGNOSIS — J0140 Acute pansinusitis, unspecified: Secondary | ICD-10-CM | POA: Diagnosis not present

## 2017-06-15 DIAGNOSIS — R05 Cough: Secondary | ICD-10-CM | POA: Diagnosis not present

## 2017-06-25 ENCOUNTER — Encounter (HOSPITAL_BASED_OUTPATIENT_CLINIC_OR_DEPARTMENT_OTHER): Payer: Self-pay | Admitting: Adult Health

## 2017-06-25 ENCOUNTER — Emergency Department (HOSPITAL_BASED_OUTPATIENT_CLINIC_OR_DEPARTMENT_OTHER): Payer: Medicare Other

## 2017-06-25 ENCOUNTER — Emergency Department (HOSPITAL_BASED_OUTPATIENT_CLINIC_OR_DEPARTMENT_OTHER)
Admission: EM | Admit: 2017-06-25 | Discharge: 2017-06-25 | Disposition: A | Discharge status: Home / Self Care | Payer: Medicare Other | Attending: Emergency Medicine | Admitting: Emergency Medicine

## 2017-06-25 ENCOUNTER — Other Ambulatory Visit: Payer: Self-pay

## 2017-06-25 DIAGNOSIS — Z79899 Other long term (current) drug therapy: Secondary | ICD-10-CM | POA: Diagnosis not present

## 2017-06-25 DIAGNOSIS — F17210 Nicotine dependence, cigarettes, uncomplicated: Secondary | ICD-10-CM | POA: Diagnosis not present

## 2017-06-25 DIAGNOSIS — R05 Cough: Secondary | ICD-10-CM | POA: Diagnosis not present

## 2017-06-25 DIAGNOSIS — J019 Acute sinusitis, unspecified: Secondary | ICD-10-CM | POA: Diagnosis not present

## 2017-06-25 MED ORDER — BENZONATATE 100 MG PO CAPS: 100.0000 mg | ORAL_CAPSULE | Freq: Three times a day (TID) | ORAL | 0 refills | Status: DC

## 2017-06-25 MED ORDER — GUAIFENESIN-CODEINE 100-10 MG/5ML PO SOLN: 5.0000 mL | Freq: Three times a day (TID) | ORAL | 0 refills | Status: DC | PRN

## 2017-06-25 MED ORDER — FLUTICASONE PROPIONATE 50 MCG/ACT NA SUSP: 1.0000 | Freq: Every day | NASAL | 2 refills | Status: AC

## 2017-06-25 NOTE — ED Triage Notes (Addendum)
PResents with sinus pressure and congestion not relieved by 7 days of antibiotics (Augmentin)  and antihistamine. She denies fevers. She reports facial pressure and congestion for 3 weeks.

## 2017-06-25 NOTE — Discharge Instructions (Signed)
Please read attached information regarding your condition. Take Tessalon Perles and cough syrup as needed for cough. Take Flonase as needed for nasal congestion. Follow-up with your ENT specialist for further evaluation. Return to ED if worsening symptoms, coughing up blood, chest pain, shortness of breath.

## 2017-06-25 NOTE — ED Provider Notes (Signed)
[facility] EMERGENCY DEPARTMENT Provider Note   CSN: [id number] Arrival date & time: 06/25/17  1520     History   Chief Complaint Chief Complaint  Patient presents with  . URI    HPI [name] [name] is a 71 y.o. female with a past medical history of deviated symptoms who is status post sinus surgery several years ago, who presents to ED for evaluation of 3-week history of sinus pressure, nasal congestion and productive cough.  She has taken Augmentin for a week several days ago but denies any improvement in symptoms.  She is also been taking Allegra.  She was unable to follow-up with her ENT specialist for this but [facility].  States that the productive cough has decreased but now she is having a severe dry cough.  She states that since her surgery several years ago, this is been the only time that she has had a bad sinus infection.  She denies any chest pain, hemoptysis, trouble breathing, trouble swallowing, sore throat.  She states that she has a popping sensation in her left ear.  HPI  Past Medical History:  Diagnosis Date  . Depression   . Deviated septum   . Diverticulitis 2014  . Hearing loss   . Sinus drainage   . Sleep apnea     Patient Active Problem List   Diagnosis Date Noted  . Transaminitis 11/19/2012  . Diverticulitis 11/14/2012    Past Surgical History:  Procedure Laterality Date  . APPENDECTOMY    . BACK SURGERY    . BREAST SURGERY    . CESAREAN SECTION    . EYE SURGERY    . FOOT SURGERY    . FOOT SURGERY    . FOOT SURGERY Left   . NECK SURGERY      OB History    No data available       Home Medications    Prior to Admission medications   Medication Sig Start Date End Date Taking? Authorizing Provider  albuterol (PROVENTIL HFA;VENTOLIN HFA) 108 (90 BASE) MCG/ACT inhaler Inhale 2 puffs into the lungs every 6 (six) hours as needed for wheezing.    [provider]  alendronate (FOSAMAX) 70 MG tablet Take 70 mg by mouth every 30 (thirty) days. Take with a full glass of water on an empty stomach on Sundays     [provider]  ALPRAZolam Duanne Moron) 0.5 MG tablet Take 0.5 mg by mouth 2 (two) times daily.    [provider]  Armodafinil (NUVIGIL) 250 MG tablet Take 250 mg by mouth daily.    [provider]  aspirin 81 MG tablet Take 81 mg by mouth every morning. Take an additional tablet if needed for leg cramps in the evening    [provider]  atorvastatin (LIPITOR) 10 MG tablet Take 10 mg by mouth daily.    [provider]  benzonatate (TESSALON) 100 MG capsule Take 1 capsule (100 mg total) by mouth every 8 (eight) hours. 06/25/17   Elizabeth Haff, PA-C  Bilberry 1000 MG CAPS Take 1 tablet by mouth every morning.     [provider]  budesonide-formoterol (SYMBICORT) 160-4.5 MCG/ACT inhaler Inhale 2 puffs into the lungs daily as needed (for wheezing).    [provider]  buPROPion (WELLBUTRIN SR) 150 MG 12 hr tablet Take 150 mg by mouth 2 (two) times daily.    [provider]  cholecalciferol (  VITAMIN D) 1000 UNITS tablet Take 1,000 Units by mouth every morning.    [provider]  docusate sodium (COLACE) 100 MG capsule Take 1 capsule (100 mg total) by mouth every 12 (twelve) hours. 04/23/16   Long, Wonda Olds, MD  DULoxetine (CYMBALTA) 60 MG capsule Take 60 mg by mouth daily.    [provider]  fluticasone (FLONASE) 50 MCG/ACT nasal spray Place 1 spray into both nostrils daily. 06/25/17   Shunsuke Granzow, PA-C  furosemide (LASIX) 20 MG tablet Take 20 mg by mouth.    [provider]  guaiFENesin-codeine 100-10 MG/5ML syrup Take 5 mLs by mouth 3 (three) times daily as needed for cough. 06/25/17   Leola Fiore, PA-C  HYDROcodone-acetaminophen (NORCO/VICODIN) 5-325 MG tablet Take 1 tablet by mouth every 6 (six) hours as needed for severe pain. 04/23/16    Long, Wonda Olds, MD  lidocaine (LIDODERM) 5 % Place 1 patch onto the skin daily. Remove & Discard patch within 12 hours or as directed by MD 04/23/16   Long, Wonda Olds, MD  Magnesium 500 MG TABS Take 1 tablet by mouth every morning.     [provider]  Multiple Vitamin (MULTIVITAMIN WITH MINERALS) TABS Take 1 tablet by mouth every morning.    [provider]  OXYCODONE HCL PO Take by mouth.    [provider]  pantoprazole (PROTONIX) 40 MG tablet Take 1 tablet (40 mg total) by mouth daily. 11/21/12   Riebock, Estill Bakes, NP  potassium chloride (K-DUR,KLOR-CON) 10 MEQ tablet Take 10 mEq by mouth 2 (two) times daily.    [provider]  sodium chloride (MURO 128) 2 % ophthalmic solution Place 1 drop into both eyes every morning.    [provider]  solifenacin (VESICARE) 10 MG tablet Take by mouth daily.    [provider]  tiZANidine (ZANAFLEX) 4 MG tablet Take 4 mg by mouth every 6 (six) hours as needed for muscle spasms.    [provider]  tolterodine (DETROL) 2 MG tablet Take 2 mg by mouth 2 (two) times daily.    [provider]  traMADol (ULTRAM) 50 MG tablet Take 50 mg by mouth every 8 (eight) hours as needed for pain.     [provider]  triamterene-hydrochlorothiazide (DYAZIDE) 37.5-25 MG per capsule Take 1 capsule by mouth daily.    [provider]  vitamin B-12 (CYANOCOBALAMIN) 1000 MCG tablet Take 1,000 mcg by mouth every morning.     [provider]    Family History Family History  Problem Relation Age of Onset  . Kidney failure Mother     Social History Social History   Tobacco Use  . Smoking status: Current Every Day Smoker    Types: Cigarettes  . Smokeless tobacco: Never Used  Substance Use Topics  . Alcohol use: No  . Drug use: No     Allergies   Doxycycline; Gabapentin; Other; and Tape   Review of Systems Review of Systems  Constitutional: Negative for appetite  change, chills and fever.  HENT: Positive for congestion, sinus pressure and sinus pain. Negative for ear discharge, ear pain, mouth sores, rhinorrhea, sneezing and sore throat.   Eyes: Negative for photophobia and visual disturbance.  Respiratory: Positive for cough. Negative for chest tightness, shortness of breath and wheezing.   Cardiovascular: Negative for chest pain and palpitations.  Gastrointestinal: Negative for abdominal pain, blood in stool, constipation, diarrhea, nausea and vomiting.  Genitourinary: Negative for dysuria, hematuria and urgency.  Musculoskeletal: Negative for myalgias.  Skin: Negative for rash.  Neurological: Negative for dizziness, weakness and light-headedness.     Physical Exam Updated Vital Signs BP 130/67 (BP Location: Right Arm)   Pulse 68   Temp (!) 97.1 F (36.2 C) (Oral)   Resp 18   SpO2 99%   Physical Exam  Constitutional: She appears well-developed and well-nourished. No distress.  HENT:  Head: Normocephalic and atraumatic.  Nose: Right sinus exhibits maxillary sinus tenderness and frontal sinus tenderness. Left sinus exhibits maxillary sinus tenderness and frontal sinus tenderness.  Eyes: Conjunctivae and EOM are normal. Right eye exhibits no discharge. Left eye exhibits no discharge. No scleral icterus.  Neck: Normal range of motion. Neck supple.  Cardiovascular: Normal rate, regular rhythm, normal heart sounds and intact distal pulses. Exam reveals no gallop and no friction rub.  No murmur heard. Pulmonary/Chest: Effort normal and breath sounds normal. No respiratory distress.  Musculoskeletal: Normal range of motion. She exhibits no edema.  Neurological: She is alert. She exhibits normal muscle tone. Coordination normal.  Skin: Skin is warm and dry. No rash noted.  Psychiatric: She has a normal mood and affect.  Nursing note and vitals reviewed.    ED Treatments / Results  Labs (all labs ordered are listed, but only abnormal results  are displayed) Labs Reviewed - No data to display  EKG  EKG Interpretation None       Radiology Dg Chest 2 View  Result Date: 06/25/2017 CLINICAL DATA:  Productive cough and congestion. EXAM: CHEST  2 VIEW COMPARISON:  04/23/2016 FINDINGS: The heart size and mediastinal contours are within normal limits. Chronic lung disease with diffuse interstitial prominence. Bibasilar scarring and atelectasis. There is no evidence of pulmonary edema, focal airspace consolidation, pneumothorax, nodule or pleural fluid. The visualized skeletal structures are unremarkable. IMPRESSION: Chronic lung disease as well as bibasilar atelectasis and scarring. Electronically Signed   By: Aletta Edouard M.D.   On: 06/25/2017 18:02    Procedures Procedures (including critical care time)  Medications Ordered in ED Medications - No data to display   Initial Impression / Assessment and Plan / ED Course  I have reviewed the triage vital signs and the nursing notes.  Pertinent labs & imaging results that were available during my care of the patient were reviewed by me and considered in my medical decision making (see chart for details).     Patient, with a past medical history of deviated septum, who is status post sinus surgery several years ago, presents to ED for evaluation of 3-week history of sinus pressure, nasal congestion and productive cough.  She completed a course of Augmentin a few days ago.  She is scheduled to meet with her ENT specialist in a few days but states that her nasal congestion and cough have been getting worse.  On physical exam she is overall well-appearing.  She is afebrile here.  She does have tenderness to palpation of the frontal and maxillary sinuses.  Lungs are clear to auscultation bilaterally.  Chest x-ray returned as negative for acute abnormality.  I suspect that due to her history of sinus issues and sinus surgery in the past, she needs an increase in her symptomatic treatment.   Will give patient Flonase, Tessalon Perles and cough syrup to help with her cough.  Encouraged her to keep her appointment with her ENT specialist for further management.  She states that these medications have helped her in the past.  Patient appears stable for discharge  at this time.  Strict return precautions given.  Patient discussed with and seen by Dr. Laverta Baltimore.  Final Clinical Impressions(s) / ED Diagnoses   Final diagnoses:  Acute sinusitis, recurrence not specified, unspecified location    ED Discharge Orders        Ordered    benzonatate (TESSALON) 100 MG capsule  Every 8 hours     06/25/17 1832    fluticasone (FLONASE) 50 MCG/ACT nasal spray  Daily     06/25/17 1832    guaiFENesin-codeine 100-10 MG/5ML syrup  3 times daily PRN     06/25/17 1832     Portions of this note were generated with Dragon dictation software. Dictation errors may occur despite best attempts at proofreading.    Delia Heady, PA-C 06/25/17 1837    Margette Fast, MD 06/26/17 [phone]

## 2017-06-25 NOTE — ED Notes (Signed)
ED Provider at bedside. Dr. Long 

## 2017-06-25 NOTE — ED Notes (Signed)
Pt given Rx x 3 for flonase, tessalon, and guaifenesin-codeine

## 2017-06-28 DIAGNOSIS — J0100 Acute maxillary sinusitis, unspecified: Secondary | ICD-10-CM | POA: Diagnosis not present

## 2017-06-28 DIAGNOSIS — J0120 Acute ethmoidal sinusitis, unspecified: Secondary | ICD-10-CM | POA: Diagnosis not present

## 2017-06-28 DIAGNOSIS — F17200 Nicotine dependence, unspecified, uncomplicated: Secondary | ICD-10-CM | POA: Diagnosis not present

## 2017-06-28 DIAGNOSIS — F17210 Nicotine dependence, cigarettes, uncomplicated: Secondary | ICD-10-CM | POA: Diagnosis not present

## 2017-07-04 DIAGNOSIS — G250 Essential tremor: Secondary | ICD-10-CM | POA: Diagnosis not present

## 2017-07-04 DIAGNOSIS — M5441 Lumbago with sciatica, right side: Secondary | ICD-10-CM | POA: Diagnosis not present

## 2017-07-04 DIAGNOSIS — M5442 Lumbago with sciatica, left side: Secondary | ICD-10-CM | POA: Diagnosis not present

## 2017-07-04 DIAGNOSIS — F5101 Primary insomnia: Secondary | ICD-10-CM | POA: Diagnosis not present

## 2017-07-04 DIAGNOSIS — Z79899 Other long term (current) drug therapy: Secondary | ICD-10-CM | POA: Diagnosis not present

## 2017-07-04 DIAGNOSIS — M542 Cervicalgia: Secondary | ICD-10-CM | POA: Diagnosis not present

## 2017-07-11 DIAGNOSIS — G4733 Obstructive sleep apnea (adult) (pediatric): Secondary | ICD-10-CM | POA: Diagnosis not present

## 2017-07-11 DIAGNOSIS — Z9989 Dependence on other enabling machines and devices: Secondary | ICD-10-CM | POA: Diagnosis not present

## 2017-07-11 DIAGNOSIS — G4719 Other hypersomnia: Secondary | ICD-10-CM | POA: Diagnosis not present

## 2017-07-11 DIAGNOSIS — R062 Wheezing: Secondary | ICD-10-CM | POA: Diagnosis not present

## 2017-07-11 DIAGNOSIS — Z6833 Body mass index (BMI) 33.0-33.9, adult: Secondary | ICD-10-CM | POA: Diagnosis not present

## 2017-07-11 DIAGNOSIS — I10 Essential (primary) hypertension: Secondary | ICD-10-CM | POA: Diagnosis not present

## 2017-07-17 DIAGNOSIS — Z720 Tobacco use: Secondary | ICD-10-CM | POA: Diagnosis not present

## 2017-07-17 DIAGNOSIS — M2141 Flat foot [pes planus] (acquired), right foot: Secondary | ICD-10-CM | POA: Diagnosis not present

## 2017-07-17 DIAGNOSIS — M85871 Other specified disorders of bone density and structure, right ankle and foot: Secondary | ICD-10-CM | POA: Diagnosis not present

## 2017-07-17 DIAGNOSIS — M2142 Flat foot [pes planus] (acquired), left foot: Secondary | ICD-10-CM | POA: Diagnosis not present

## 2017-07-17 DIAGNOSIS — M19072 Primary osteoarthritis, left ankle and foot: Secondary | ICD-10-CM | POA: Diagnosis not present

## 2017-07-17 DIAGNOSIS — M7731 Calcaneal spur, right foot: Secondary | ICD-10-CM | POA: Diagnosis not present

## 2017-07-17 DIAGNOSIS — M7732 Calcaneal spur, left foot: Secondary | ICD-10-CM | POA: Diagnosis not present

## 2017-07-17 DIAGNOSIS — M2012 Hallux valgus (acquired), left foot: Secondary | ICD-10-CM | POA: Diagnosis not present

## 2017-07-17 DIAGNOSIS — I10 Essential (primary) hypertension: Secondary | ICD-10-CM | POA: Diagnosis not present

## 2017-07-17 DIAGNOSIS — M7742 Metatarsalgia, left foot: Secondary | ICD-10-CM | POA: Diagnosis not present

## 2017-07-17 DIAGNOSIS — M7741 Metatarsalgia, right foot: Secondary | ICD-10-CM | POA: Diagnosis not present

## 2017-07-17 DIAGNOSIS — M722 Plantar fascial fibromatosis: Secondary | ICD-10-CM | POA: Diagnosis not present

## 2017-07-17 DIAGNOSIS — M85872 Other specified disorders of bone density and structure, left ankle and foot: Secondary | ICD-10-CM | POA: Diagnosis not present

## 2017-07-31 DIAGNOSIS — J329 Chronic sinusitis, unspecified: Secondary | ICD-10-CM | POA: Diagnosis not present

## 2017-07-31 DIAGNOSIS — J310 Chronic rhinitis: Secondary | ICD-10-CM | POA: Diagnosis not present

## 2017-07-31 DIAGNOSIS — F17200 Nicotine dependence, unspecified, uncomplicated: Secondary | ICD-10-CM | POA: Diagnosis not present

## 2017-08-07 DIAGNOSIS — Z79899 Other long term (current) drug therapy: Secondary | ICD-10-CM | POA: Diagnosis not present

## 2017-08-07 DIAGNOSIS — G603 Idiopathic progressive neuropathy: Secondary | ICD-10-CM | POA: Diagnosis not present

## 2017-08-07 DIAGNOSIS — R270 Ataxia, unspecified: Secondary | ICD-10-CM | POA: Diagnosis not present

## 2017-08-07 DIAGNOSIS — G4733 Obstructive sleep apnea (adult) (pediatric): Secondary | ICD-10-CM | POA: Diagnosis not present

## 2017-08-07 DIAGNOSIS — M5412 Radiculopathy, cervical region: Secondary | ICD-10-CM | POA: Diagnosis not present

## 2017-08-07 DIAGNOSIS — G5603 Carpal tunnel syndrome, bilateral upper limbs: Secondary | ICD-10-CM | POA: Diagnosis not present

## 2017-08-07 DIAGNOSIS — M5417 Radiculopathy, lumbosacral region: Secondary | ICD-10-CM | POA: Diagnosis not present

## 2017-08-07 DIAGNOSIS — G250 Essential tremor: Secondary | ICD-10-CM | POA: Diagnosis not present

## 2017-08-21 DIAGNOSIS — H35372 Puckering of macula, left eye: Secondary | ICD-10-CM | POA: Diagnosis not present

## 2017-08-21 DIAGNOSIS — H353121 Nonexudative age-related macular degeneration, left eye, early dry stage: Secondary | ICD-10-CM | POA: Diagnosis not present

## 2017-08-21 DIAGNOSIS — H31113 Age-related choroidal atrophy, bilateral: Secondary | ICD-10-CM | POA: Diagnosis not present

## 2017-08-21 DIAGNOSIS — H353113 Nonexudative age-related macular degeneration, right eye, advanced atrophic without subfoveal involvement: Secondary | ICD-10-CM | POA: Diagnosis not present

## 2017-08-28 DIAGNOSIS — B079 Viral wart, unspecified: Secondary | ICD-10-CM | POA: Diagnosis not present

## 2017-08-28 DIAGNOSIS — D229 Melanocytic nevi, unspecified: Secondary | ICD-10-CM | POA: Diagnosis not present

## 2017-08-28 DIAGNOSIS — L570 Actinic keratosis: Secondary | ICD-10-CM | POA: Diagnosis not present

## 2017-08-28 DIAGNOSIS — D485 Neoplasm of uncertain behavior of skin: Secondary | ICD-10-CM | POA: Diagnosis not present

## 2017-08-28 DIAGNOSIS — L820 Inflamed seborrheic keratosis: Secondary | ICD-10-CM | POA: Diagnosis not present

## 2017-09-16 ENCOUNTER — Other Ambulatory Visit: Payer: Self-pay

## 2017-09-16 ENCOUNTER — Emergency Department (HOSPITAL_BASED_OUTPATIENT_CLINIC_OR_DEPARTMENT_OTHER): Payer: Medicare Other

## 2017-09-16 ENCOUNTER — Emergency Department (HOSPITAL_BASED_OUTPATIENT_CLINIC_OR_DEPARTMENT_OTHER)
Admission: EM | Admit: 2017-09-16 | Discharge: 2017-09-16 | Disposition: A | Discharge status: Home / Self Care | Payer: Medicare Other | Attending: Emergency Medicine | Admitting: Emergency Medicine

## 2017-09-16 ENCOUNTER — Encounter (HOSPITAL_BASED_OUTPATIENT_CLINIC_OR_DEPARTMENT_OTHER): Payer: Self-pay | Admitting: *Deleted

## 2017-09-16 DIAGNOSIS — Z79899 Other long term (current) drug therapy: Secondary | ICD-10-CM | POA: Diagnosis not present

## 2017-09-16 DIAGNOSIS — M79605 Pain in left leg: Secondary | ICD-10-CM | POA: Diagnosis present

## 2017-09-16 DIAGNOSIS — Z85828 Personal history of other malignant neoplasm of skin: Secondary | ICD-10-CM | POA: Diagnosis not present

## 2017-09-16 DIAGNOSIS — M7122 Synovial cyst of popliteal space [Baker], left knee: Secondary | ICD-10-CM | POA: Diagnosis not present

## 2017-09-16 DIAGNOSIS — F17210 Nicotine dependence, cigarettes, uncomplicated: Secondary | ICD-10-CM | POA: Diagnosis not present

## 2017-09-16 DIAGNOSIS — M79662 Pain in left lower leg: Secondary | ICD-10-CM | POA: Diagnosis not present

## 2017-09-16 DIAGNOSIS — M25562 Pain in left knee: Secondary | ICD-10-CM | POA: Diagnosis not present

## 2017-09-16 DIAGNOSIS — Z7982 Long term (current) use of aspirin: Secondary | ICD-10-CM | POA: Diagnosis not present

## 2017-09-16 DIAGNOSIS — R600 Localized edema: Secondary | ICD-10-CM | POA: Diagnosis not present

## 2017-09-16 HISTORY — DX: Unspecified malignant neoplasm of skin, unspecified: C44.90

## 2017-09-16 NOTE — ED Provider Notes (Signed)
[facility] EMERGENCY DEPARTMENT Provider Note   CSN: [id number] Arrival date & time: 09/16/17  1804     History   Chief Complaint Chief Complaint  Patient presents with  . Leg Pain    HPI [name] [name] is a 72 y.o. female with a history of hearing loss, who presents today for evaluation of left knee pain.  She reports that for approximately 10 days she has had aching pains in her left knee and proximal calf.  She denies any injury at the start of this.  She has not had fevers or chills recently.  She does not have a history of blood clots but is concerned there may be a blood clot.  She denies   HPI  Past Medical History:  Diagnosis Date  . Depression   . Deviated septum   . Diverticulitis 2014  . Hearing loss   . Sinus drainage   . Skin cancer   . Sleep apnea     Patient Active Problem List   Diagnosis Date Noted  . Transaminitis 11/19/2012  . Diverticulitis 11/14/2012    Past Surgical History:  Procedure Laterality Date  . APPENDECTOMY    . BACK SURGERY    . BREAST SURGERY    . CESAREAN SECTION    . EYE SURGERY    . FOOT SURGERY    . FOOT SURGERY    . FOOT SURGERY Left   . MOHS SURGERY    . NECK SURGERY      OB History    No data available       Home Medications    Prior to Admission medications   Medication Sig Start Date End Date Taking? Authorizing Provider  albuterol (PROVENTIL HFA;VENTOLIN HFA) 108 (90 BASE) MCG/ACT inhaler Inhale 2 puffs into the lungs every 6 (six) hours as needed for wheezing.    [provider]  alendronate (FOSAMAX) 70 MG tablet Take 70 mg by mouth every 30 (thirty) days. Take with a full glass of water on an empty stomach on Sundays     [provider]  ALPRAZolam Duanne Moron) 0.5 MG tablet Take 0.5 mg by mouth 2 (two) times daily.    [provider]  Armodafinil (NUVIGIL) 250 MG tablet Take 250 mg by mouth daily.    [provider]  aspirin 81 MG tablet Take 81 mg by mouth  every morning. Take an additional tablet if needed for leg cramps in the evening    [provider]  atorvastatin (LIPITOR) 10 MG tablet Take 10 mg by mouth daily.    [provider]  benzonatate (TESSALON) 100 MG capsule Take 1 capsule (100 mg total) by mouth every 8 (eight) hours. 06/25/17   Khatri, Hina, PA-C  Bilberry 1000 MG CAPS Take 1 tablet by mouth every morning.     [provider]  budesonide-formoterol (SYMBICORT) 160-4.5 MCG/ACT inhaler Inhale 2 puffs into the lungs daily as needed (for wheezing).    [provider]  buPROPion (WELLBUTRIN SR) 150 MG 12 hr tablet Take 150 mg by mouth 2 (two) times daily.    [provider]  cholecalciferol (VITAMIN D) 1000 UNITS tablet Take 1,000 Units by mouth every morning.    [provider]  docusate sodium (COLACE) 100 MG capsule Take 1 capsule (100 mg total) by mouth every 12 (twelve) hours. 04/23/16   Long, Wonda Olds, MD  DULoxetine (CYMBALTA) 60 MG capsule Take 60 mg by mouth daily.    [provider]  fluticasone (FLONASE) 50 MCG/ACT nasal spray Place 1 spray into both nostrils daily. 06/25/17   Khatri, Hina, PA-C  furosemide (LASIX) 20 MG tablet Take 20 mg by mouth.    [provider]  guaiFENesin-codeine 100-10 MG/5ML syrup Take 5 mLs by mouth 3 (three) times daily as needed for cough. 06/25/17   Long, Wonda Olds, MD  HYDROcodone-acetaminophen (NORCO/VICODIN) 5-325 MG tablet Take 1 tablet by mouth every 6 (six) hours as needed for severe pain. 04/23/16   Long, Wonda Olds, MD  lidocaine (LIDODERM) 5 % Place 1 patch onto the skin daily. Remove & Discard patch within 12 hours or as directed by MD 04/23/16   Long, Wonda Olds, MD  Magnesium 500 MG TABS Take 1 tablet by mouth every morning.     [provider]  Multiple Vitamin (MULTIVITAMIN WITH MINERALS) TABS Take 1 tablet by mouth every morning.    [provider]  OXYCODONE HCL PO Take by mouth.    [provider]  pantoprazole (PROTONIX) 40 MG tablet Take 1 tablet (40 mg total) by mouth daily. 11/21/12   Riebock, Estill Bakes, NP  potassium chloride (K-DUR,KLOR-CON) 10 MEQ tablet Take 10 mEq by mouth 2 (two) times daily.    [provider]  sodium chloride (MURO 128) 2 % ophthalmic solution Place 1 drop into both eyes every morning.    [provider]  solifenacin (VESICARE) 10 MG tablet Take by mouth daily.    [provider]  tiZANidine (ZANAFLEX) 4 MG tablet Take 4 mg by mouth every 6 (six) hours as needed for muscle spasms.    [provider]  tolterodine (DETROL) 2 MG tablet Take 2 mg by mouth 2 (two) times daily.    [provider]  traMADol (ULTRAM) 50 MG tablet Take 50 mg by mouth every 8 (eight) hours as needed for pain.     [provider]  triamterene-hydrochlorothiazide (DYAZIDE) 37.5-25 MG per capsule Take 1 capsule by mouth daily.    [provider]  vitamin B-12 (CYANOCOBALAMIN) 1000 MCG tablet Take 1,000 mcg by mouth every morning.     [provider]    Family History Family History  Problem Relation Age of Onset  . Kidney failure Mother     Social History Social History   Tobacco Use  . Smoking status: Current Every Day Smoker    Types: Cigarettes  . Smokeless tobacco: Never Used  Substance Use Topics  . Alcohol use: No  . Drug use: No     Allergies   Doxycycline; Gabapentin; Other; and Tape   Review of Systems Review of Systems  Constitutional: Negative for chills and fever.  Musculoskeletal:       Pain in left knee and swelling  Skin: Negative for color change and wound.  Neurological: Negative for weakness and numbness.  All other systems reviewed and are negative.    Physical Exam Updated Vital Signs BP 111/63   Pulse 84   Temp 98.6 F (37 C)   Resp 18   Ht 5\' 2"  (1.575 m)   Wt 81.6 kg (180 lb)   SpO2 96%   BMI 32.92 kg/m   Physical Exam  Constitutional: She appears  well-developed and well-nourished. No distress.  HENT:  Head: Normocephalic and atraumatic.  Eyes: Conjunctivae are normal. Right eye exhibits no discharge. Left eye exhibits no discharge. No scleral icterus.  Neck: Normal range of motion.  Cardiovascular: Normal rate, regular rhythm and intact distal pulses.  2+ DP/PT pulses bilateral.  Pulmonary/Chest: Effort normal. No stridor. No respiratory distress.  Abdominal: She exhibits no distension.  Musculoskeletal: She exhibits no edema or deformity.  There is mild tenderness to palpation of left knee.  This is worse on the posterior aspect/over the upper calf.  The knee is mildly swollen without abnormal warmth or redness.  Knee has limited flexion secondary to pain.  Grossly intact on my physical exam.  There is mild swelling over the posterior knee.  5/5 strength in bilateral lower extremities.    Neurological: She is alert. She exhibits normal muscle tone.  Skin: Skin is warm and dry. She is not diaphoretic.  No skin breaks or obvious wounds over the left knee.  Psychiatric: She has a normal mood and affect. Her behavior is normal.  Nursing note and vitals reviewed.    ED Treatments / Results  Labs (all labs ordered are listed, but only abnormal results are displayed) Labs Reviewed - No data to display  EKG  EKG Interpretation None       Radiology US Venous Img Lower  Left (dvt Study)  Result Date: 09/16/2017 CLINICAL DATA:  Lower extremity pain and edema EXAM: LEFT LOWER EXTREMITY VENOUS DUPLEX ULTRASOUND TECHNIQUE: Gray-scale sonography with graded compression, as well as color Doppler and duplex ultrasound were performed to evaluate the left lower extremity deep venous system from the level of the common femoral vein and including the common femoral, femoral, profunda femoral, popliteal and calf veins including the posterior tibial, peroneal and gastrocnemius veins when visible. The superficial great saphenous vein was also  interrogated. Spectral Doppler was utilized to evaluate flow at rest and with distal augmentation maneuvers in the common femoral, femoral and popliteal veins. COMPARISON:  None. FINDINGS: Contralateral Common Femoral Vein: Respiratory phasicity is normal and symmetric with the symptomatic side. No evidence of thrombus. Normal compressibility. Common Femoral Vein: No evidence of thrombus. Normal compressibility, respiratory phasicity and response to augmentation. Saphenofemoral Junction: No evidence of thrombus. Normal compressibility and flow on color Doppler imaging. Profunda Femoral Vein: No evidence of thrombus. Normal compressibility and flow on color Doppler imaging. Femoral Vein: No evidence of thrombus. Normal compressibility, respiratory phasicity and response to augmentation. Popliteal Vein: No evidence of thrombus. Normal compressibility, respiratory phasicity and response to augmentation. Calf Veins: No evidence of thrombus. Normal compressibility and flow on color Doppler imaging. Note that the left peroneal vein could not be visualized. Superficial Great Saphenous Vein: No evidence of thrombus. Normal compressibility. Venous Reflux:  None. Other Findings: In the left popliteal fossa, there is a virtually anechoic fluid collection measuring 3.8 x 0.7 x 2.0 cm. There is a nearby second fluid collection measuring 2.2 x 0.5 x 2.7 cm. IMPRESSION: No evidence of deep venous thrombosis in the left lower extremity. Note that the left peroneal vein cannot be visualized. Right common femoral vein patent. Fluid-filled structures in the popliteal fossa, likely Baker's cysts. Electronically Signed   By: Lowella Grip III M.D.   On: 09/16/2017 21:16   Dg Knee Complete 4 Views Left  Result Date: 09/16/2017 CLINICAL DATA:  Pain for 10 days EXAM: LEFT KNEE - COMPLETE 4+ VIEW COMPARISON:  None. FINDINGS: Frontal, lateral, and bilateral oblique views were obtained. No fracture or dislocation. No knee joint  effusion. There is mild narrowing of the patellofemoral joint. 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 Other joint spaces appear normal. 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 Other joint spaces appear normal. No fracture or joint effusion. Electronically Signed   By: Lowella Grip III M.D.   On: 09/16/2017 21:20    Procedures Procedures (including critical care time)  Medications Ordered in ED Medications - No data to display   Initial Impression / Assessment and Plan / ED Course  I have reviewed the triage vital signs and the nursing notes.  Pertinent labs & imaging results that were available during my care of the patient were reviewed by me and considered in my medical decision making (see chart for details).    Patient presents today for evaluation of atraumatic left knee pain.  Knee is swollen, mostly in the posterior aspect, without abnormal warmth or redness.  She has not had fevers chills recently appears generally well.  Do not suspect septic arthritis.  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  DVT study was obtained due to pain over the Left proximal calf which did not show any evidence of blood clots.  DVT study did show a Baker's cyst which is consistent with the location of her pain and symptoms.  She was discharged home with instructions to elevate her leg, take Tylenol as needed for pain, and follow-up with her primary care provider.  Return precautions discussed and she stated her understanding.  This patient was seen as a shared visit with Dr. Leonette Monarch who evaluated the patient and agreed with my plan.  Final Clinical Impressions(s) / ED Diagnoses   Final diagnoses:  Left leg pain  Baker's cyst of knee, left    ED Discharge Orders    None       Ollen Gross 09/17/17 [place]    Fatima Blank, MD 09/18/17 [phone]

## 2017-09-16 NOTE — Discharge Instructions (Signed)
Please take Tylenol (acetaminophen) to relieve your pain.  You may take tylenol, up to 1,000 mg (two extra strength pills).  Do not take more than 3,000 mg tylenol in a 24 hour period.  Please check all medication labels as many medications such as pain and cold medications may contain tylenol. Please do not drink alcohol while taking this medication.  ° °

## 2017-09-16 NOTE — ED Triage Notes (Signed)
Pt reports left knee pain x 1 week. Denies injury. Pt drove herself here

## 2017-09-18 ENCOUNTER — Ambulatory Visit (INDEPENDENT_AMBULATORY_CARE_PROVIDER_SITE_OTHER): Payer: Medicare Other | Admitting: Family Medicine

## 2017-09-18 ENCOUNTER — Encounter: Payer: Self-pay | Admitting: Family Medicine

## 2017-09-18 DIAGNOSIS — M25562 Pain in left knee: Secondary | ICD-10-CM

## 2017-09-18 MED ORDER — METHYLPREDNISOLONE ACETATE 40 MG/ML IJ SUSP
40.0000 mg | Freq: Once | INTRAMUSCULAR | Status: AC
  Administered 2017-09-18: 40 mg via INTRA_ARTICULAR

## 2017-09-18 NOTE — Patient Instructions (Signed)
Your pain is due to arthritis with a large bakers cyst. These are the different medications you can take for this: Tylenol 500mg  1-2 tabs three times a day for pain. Capsaicin, aspercreme, or biofreeze topically up to four times a day may also help with pain. Some supplements that may help for arthritis: Boswellia extract, curcumin, pycnogenol Aleve 1-2 tabs twice a day with food. Cortisone injections are an option - you were given this today. If cortisone injections do not help, there are different types of shots that may help but they take longer to take effect. It's important that you continue to stay active. Straight leg raises, knee extensions 3 sets of 10 once a day (add ankle weight if these become too easy). Consider physical therapy to strengthen muscles around the joint that hurts to take pressure off of the joint itself. Shoe inserts with good arch support may be helpful. Heat or ice 15 minutes at a time 3-4 times a day as needed to help with pain. Water aerobics and cycling with low resistance are the best two types of exercise for arthritis though any exercise is ok as long as it doesn't worsen the pain. Follow up with me in 1 month.

## 2017-09-20 ENCOUNTER — Encounter: Payer: Self-pay | Admitting: Family Medicine

## 2017-09-20 DIAGNOSIS — M79605 Pain in left leg: Secondary | ICD-10-CM | POA: Insufficient documentation

## 2017-09-20 DIAGNOSIS — J342 Deviated nasal septum: Secondary | ICD-10-CM | POA: Insufficient documentation

## 2017-09-20 DIAGNOSIS — M25562 Pain in left knee: Secondary | ICD-10-CM

## 2017-09-20 HISTORY — DX: Pain in left knee: M25.562

## 2017-09-20 NOTE — Assessment & Plan Note (Signed)
Independently reviewed radiographs noting mild arthritis.  While her Doppler ultrasound showed evidence of 2 Baker's cysts I could only appreciate a very small Baker's cyst today with some soft tissue swelling surrounding this consistent with rupture.  Believe her pain is primarily due to arthritis leading to these Baker's cyst.  We discussed Tylenol, topical medications, supplements that may help, Aleve.  She was given an intra-articular cortisone injection today.  Reviewed home exercise program.  Ice or heat.  Follow-up in 1 month.  After informed written consent timeout was performed, patient was lying supine on exam table. Left knee was prepped with alcohol swab and utilizing superolateral approach with ultrasound guidance, patient's left knee was injected intraarticularly with 3:1 bupivicaine: depomedrol. Patient tolerated the procedure well without immediate complications.

## 2017-09-20 NOTE — Progress Notes (Signed)
PCP: [name] [name], No Pcp Per  Subjective:   HPI: [name] [name] is a 72 y.o. female here for left knee pain.  [name] [name] reports about 10 days ago she started to get a sharp pain within her left knee both medially and posteriorly. She tried Tylenol without much benefit. She has had to use a cane the pain is worse with walking. She reports having a history of peripheral neuropathy and has had shots in her neck and back. She had a Doppler ultrasound of her left leg which showed no evidence of a DVT but did show evidence of Baker's cyst. Denies prior problems with her left knee. Pain level is a 10 out of 10 currently and sharp. No skin changes or numbness.  Past Medical History:  Diagnosis Date  . Depression   . Deviated septum   . Diverticulitis 2014  . Hearing loss   . Sinus drainage   . Skin cancer   . Sleep apnea     Current Outpatient Medications on File Prior to Visit  Medication Sig Dispense Refill  . albuterol (PROVENTIL HFA;VENTOLIN HFA) 108 (90 BASE) MCG/ACT inhaler Inhale 2 puffs into the lungs every 6 (six) hours as needed for wheezing.    Marland Kitchen alendronate (FOSAMAX) 70 MG tablet Take 70 mg by mouth every 30 (thirty) days. Take with a full glass of water on an empty stomach on Sundays     . ALPRAZolam (XANAX) 0.5 MG tablet Take 0.5 mg by mouth 2 (two) times daily.    . Armodafinil (NUVIGIL) 250 MG tablet Take 250 mg by mouth daily.    Marland Kitchen aspirin 81 MG tablet Take 81 mg by mouth every morning. Take an additional tablet if needed for leg cramps in the evening    . atorvastatin (LIPITOR) 10 MG tablet Take 10 mg by mouth daily.    . Bilberry 1000 MG CAPS Take 1 tablet by mouth every morning.     . budesonide-formoterol (SYMBICORT) 160-4.5 MCG/ACT inhaler Inhale 2 puffs into the lungs daily as needed (for wheezing).    Marland Kitchen buPROPion (WELLBUTRIN SR) 150 MG 12 hr tablet Take 150 mg by mouth 2 (two) times daily.    . cholecalciferol (VITAMIN D) 1000 UNITS tablet Take 1,000 Units by mouth every  morning.    . docusate sodium (COLACE) 100 MG capsule Take 1 capsule (100 mg total) by mouth every 12 (twelve) hours. 60 capsule 0  . DULoxetine (CYMBALTA) 60 MG capsule Take 60 mg by mouth daily.    . fexofenadine (ALLEGRA) 180 MG tablet TAKE 1 TABLET (180 MG TOTAL) BY MOUTH DAILY.  2  . fluticasone (FLONASE) 50 MCG/ACT nasal spray Place 1 spray into both nostrils daily. 16 g 2  . furosemide (LASIX) 20 MG tablet Take 20 mg by mouth.    . lidocaine (LIDODERM) 5 % Place 1 patch onto the skin daily. Remove & Discard patch within 12 hours or as directed by MD 30 patch 0  . Magnesium 500 MG TABS Take 1 tablet by mouth every morning.     . Multiple Vitamin (MULTIVITAMIN WITH MINERALS) TABS Take 1 tablet by mouth every morning.    . Olopatadine HCl 0.2 % SOLN     . oxyCODONE (OXY IR/ROXICODONE) 5 MG immediate release tablet TAKE 1 TABLET BY MOUTH 3 TIMES A DAY AS NEEDED FOR 30 DAYS  0  . pantoprazole (PROTONIX) 40 MG tablet Take 1 tablet (40 mg total) by mouth daily. 30 tablet 0  . primidone (MYSOLINE) 50 MG  tablet PLEASE SEE ATTACHED FOR DETAILED DIRECTIONS  6  . sodium chloride (MURO 128) 2 % ophthalmic solution Place 1 drop into both eyes every morning.    . solifenacin (VESICARE) 10 MG tablet Take by mouth daily.    Marland Kitchen tiZANidine (ZANAFLEX) 4 MG tablet Take 4 mg by mouth every 6 (six) hours as needed for muscle spasms.    Marland Kitchen tolterodine (DETROL) 2 MG tablet Take 2 mg by mouth 2 (two) times daily.    . traMADol (ULTRAM) 50 MG tablet Take 50 mg by mouth every 8 (eight) hours as needed for pain.     Marland Kitchen triamterene-hydrochlorothiazide (DYAZIDE) 37.5-25 MG per capsule Take 1 capsule by mouth daily.    . vitamin B-12 (CYANOCOBALAMIN) 1000 MCG tablet Take 1,000 mcg by mouth every morning.      No current facility-administered medications on file prior to visit.     Past Surgical History:  Procedure Laterality Date  . APPENDECTOMY    . BACK SURGERY    . BREAST SURGERY    . CESAREAN SECTION    . EYE  SURGERY    . FOOT SURGERY    . FOOT SURGERY    . FOOT SURGERY Left   . MOHS SURGERY    . NECK SURGERY      Allergies  Allergen Reactions  . Doxycycline   . Gabapentin   . Other     Eye drop  . Tape Hives    Pt can tolerate paper tape    Social History   Socioeconomic History  . Marital status: Divorced    Spouse name: Not on file  . Number of children: Not on file  . Years of education: Not on file  . Highest education level: Not on file  Occupational History  . Not on file  Social Needs  . Financial resource strain: Not on file  . Food insecurity:    Worry: Not on file    Inability: Not on file  . Transportation needs:    Medical: Not on file    Non-medical: Not on file  Tobacco Use  . Smoking status: Current Every Day Smoker    Types: Cigarettes  . Smokeless tobacco: Never Used  Substance and Sexual Activity  . Alcohol use: No  . Drug use: No  . Sexual activity: Not on file  Lifestyle  . Physical activity:    Days per week: Not on file    Minutes per session: Not on file  . Stress: Not on file  Relationships  . Social connections:    Talks on phone: Not on file    Gets together: Not on file    Attends religious service: Not on file    Active member of club or organization: Not on file    Attends meetings of clubs or organizations: Not on file    Relationship status: Not on file  . Intimate partner violence:    Fear of current or ex partner: Not on file    Emotionally abused: Not on file    Physically abused: Not on file    Forced sexual activity: Not on file  Other Topics Concern  . Not on file  Social History Narrative  . Not on file    Family History  Problem Relation Age of Onset  . Kidney failure Mother     BP 132/77   Pulse 76   Ht 5\' 2"  (1.575 m)   Wt 175 lb (79.4 kg)   BMI  32.01 kg/m   Review of Systems: See HPI above.     Objective:  Physical Exam:  Gen: NAD, comfortable in exam room  Left knee: Mild effusion.   Fullness posterior knee.  No bruising, other deformity. TTP medial joint line and popliteal fossa.  No other tenderness. ROM 0-90 degrees, 5/5 strength flexion and extension. Negative ant/post drawers. Negative valgus/varus testing. Negative lachmanns. Negative mcmurrays, apleys, patellar apprehension. NV intact distally.  Right knee: No deformity. FROM with 5/5 strength. No tenderness to palpation. NVI distally.  MSK u/s:  Very small bakers cyst visualized left knee with soft tissue swelling, consistent with cyst that has ruptured.  Small effusion.   Assessment & Plan:  1.  Left knee pain: Independently reviewed radiographs noting mild arthritis.  While her Doppler ultrasound showed evidence of 2 Baker's cysts I could only appreciate a very small Baker's cyst today with some soft tissue swelling surrounding this consistent with rupture.  Believe her pain is primarily due to arthritis leading to these Baker's cyst.  We discussed Tylenol, topical medications, supplements that may help, Aleve.  She was given an intra-articular cortisone injection today.  Reviewed home exercise program.  Ice or heat.  Follow-up in 1 month.  After informed written consent timeout was performed, [name] [name] was lying supine on exam table. Left knee was prepped with alcohol swab and utilizing superolateral approach with ultrasound guidance, [name] [name]'s left knee was injected intraarticularly with 3:1 bupivicaine: depomedrol. [name] [name] tolerated the procedure well without immediate complications.

## 2017-09-24 ENCOUNTER — Ambulatory Visit (INDEPENDENT_AMBULATORY_CARE_PROVIDER_SITE_OTHER): Payer: Medicare Other | Admitting: Family Medicine

## 2017-09-24 ENCOUNTER — Encounter: Payer: Self-pay | Admitting: Family Medicine

## 2017-09-24 DIAGNOSIS — M25562 Pain in left knee: Secondary | ICD-10-CM

## 2017-09-24 MED ORDER — CEPHALEXIN 500 MG PO CAPS: 500.0000 mg | ORAL_CAPSULE | Freq: Four times a day (QID) | ORAL | 0 refills | Status: DC

## 2017-09-24 NOTE — Progress Notes (Signed)
PCP: Patient, No Pcp Per  Subjective:   HPI: Patient is a 72 y.o. female here for left knee pain.  3/19: Patient reports about 10 days ago she started to get a sharp pain within her left knee both medially and posteriorly. She tried Tylenol without much benefit. She has had to use a cane the pain is worse with walking. She reports having a history of peripheral neuropathy and has had shots in her neck and back. She had a Doppler ultrasound of her left leg which showed no evidence of a DVT but did show evidence of Baker's cyst. Denies prior problems with her left knee. Pain level is a 10 out of 10 currently and sharp. No skin changes or numbness.  3/25: Patient returns reporting increased pain and swelling in her left lower leg. She denies any fevers, chills, or sweats. She does feel that the knee does feel warmer however. She has been using heat to the area and taking Aleve. She states she does not like to elevate it because it hurts worse. Pain level is 10 out of 10 at worst and sharp. Minimal redness anterior lower leg, no numbness.  Past Medical History:  Diagnosis Date  . Depression   . Deviated septum   . Diverticulitis 2014  . Hearing loss   . Sinus drainage   . Skin cancer   . Sleep apnea     Current Outpatient Medications on File Prior to Visit  Medication Sig Dispense Refill  . albuterol (PROVENTIL HFA;VENTOLIN HFA) 108 (90 BASE) MCG/ACT inhaler Inhale 2 puffs into the lungs every 6 (six) hours as needed for wheezing.    Marland Kitchen alendronate (FOSAMAX) 70 MG tablet Take 70 mg by mouth every 30 (thirty) days. Take with a full glass of water on an empty stomach on Sundays     . ALPRAZolam (XANAX) 0.5 MG tablet Take 0.5 mg by mouth 2 (two) times daily.    . Armodafinil (NUVIGIL) 250 MG tablet Take 250 mg by mouth daily.    Marland Kitchen aspirin 81 MG tablet Take 81 mg by mouth every morning. Take an additional tablet if needed for leg cramps in the evening    . atorvastatin (LIPITOR) 10  MG tablet Take 10 mg by mouth daily.    . Bilberry 1000 MG CAPS Take 1 tablet by mouth every morning.     . budesonide-formoterol (SYMBICORT) 160-4.5 MCG/ACT inhaler Inhale 2 puffs into the lungs daily as needed (for wheezing).    Marland Kitchen buPROPion (WELLBUTRIN SR) 150 MG 12 hr tablet Take 150 mg by mouth 2 (two) times daily.    . cholecalciferol (VITAMIN D) 1000 UNITS tablet Take 1,000 Units by mouth every morning.    . docusate sodium (COLACE) 100 MG capsule Take 1 capsule (100 mg total) by mouth every 12 (twelve) hours. 60 capsule 0  . DULoxetine (CYMBALTA) 60 MG capsule Take 60 mg by mouth daily.    . fexofenadine (ALLEGRA) 180 MG tablet TAKE 1 TABLET (180 MG TOTAL) BY MOUTH DAILY.  2  . fluticasone (FLONASE) 50 MCG/ACT nasal spray Place 1 spray into both nostrils daily. 16 g 2  . furosemide (LASIX) 20 MG tablet Take 20 mg by mouth.    . lidocaine (LIDODERM) 5 % Place 1 patch onto the skin daily. Remove & Discard patch within 12 hours or as directed by MD 30 patch 0  . Magnesium 500 MG TABS Take 1 tablet by mouth every morning.     . Multiple Vitamin (  MULTIVITAMIN WITH MINERALS) TABS Take 1 tablet by mouth every morning.    . Olopatadine HCl 0.2 % SOLN     . oxyCODONE (OXY IR/ROXICODONE) 5 MG immediate release tablet TAKE 1 TABLET BY MOUTH 3 TIMES A DAY AS NEEDED FOR 30 DAYS  0  . pantoprazole (PROTONIX) 40 MG tablet Take 1 tablet (40 mg total) by mouth daily. 30 tablet 0  . primidone (MYSOLINE) 50 MG tablet PLEASE SEE ATTACHED FOR DETAILED DIRECTIONS  6  . sodium chloride (MURO 128) 2 % ophthalmic solution Place 1 drop into both eyes every morning.    . solifenacin (VESICARE) 10 MG tablet Take by mouth daily.    Marland Kitchen tiZANidine (ZANAFLEX) 4 MG tablet Take 4 mg by mouth every 6 (six) hours as needed for muscle spasms.    Marland Kitchen tolterodine (DETROL) 2 MG tablet Take 2 mg by mouth 2 (two) times daily.    . traMADol (ULTRAM) 50 MG tablet Take 50 mg by mouth every 8 (eight) hours as needed for pain.     Marland Kitchen  triamterene-hydrochlorothiazide (DYAZIDE) 37.5-25 MG per capsule Take 1 capsule by mouth daily.    . vitamin B-12 (CYANOCOBALAMIN) 1000 MCG tablet Take 1,000 mcg by mouth every morning.      No current facility-administered medications on file prior to visit.     Past Surgical History:  Procedure Laterality Date  . APPENDECTOMY    . BACK SURGERY    . BREAST SURGERY    . CESAREAN SECTION    . EYE SURGERY    . FOOT SURGERY    . FOOT SURGERY    . FOOT SURGERY Left   . MOHS SURGERY    . NECK SURGERY      Allergies  Allergen Reactions  . Doxycycline   . Gabapentin   . Other     Eye drop  . Tape Hives    Pt can tolerate paper tape    Social History   Socioeconomic History  . Marital status: Divorced    Spouse name: Not on file  . Number of children: Not on file  . Years of education: Not on file  . Highest education level: Not on file  Occupational History  . Not on file  Social Needs  . Financial resource strain: Not on file  . Food insecurity:    Worry: Not on file    Inability: Not on file  . Transportation needs:    Medical: Not on file    Non-medical: Not on file  Tobacco Use  . Smoking status: Current Every Day Smoker    Types: Cigarettes  . Smokeless tobacco: Never Used  Substance and Sexual Activity  . Alcohol use: No  . Drug use: No  . Sexual activity: Not on file  Lifestyle  . Physical activity:    Days per week: Not on file    Minutes per session: Not on file  . Stress: Not on file  Relationships  . Social connections:    Talks on phone: Not on file    Gets together: Not on file    Attends religious service: Not on file    Active member of club or organization: Not on file    Attends meetings of clubs or organizations: Not on file    Relationship status: Not on file  . Intimate partner violence:    Fear of current or ex partner: Not on file    Emotionally abused: Not on file    Physically  abused: Not on file    Forced sexual activity: Not  on file  Other Topics Concern  . Not on file  Social History Narrative  . Not on file    Family History  Problem Relation Age of Onset  . Kidney failure Mother     BP 121/72   Pulse 89   Ht 5\' 2"  (1.575 m)   Wt 175 lb (79.4 kg)   BMI 32.01 kg/m   Review of Systems: See HPI above.     Objective:  Physical Exam:  Gen: NAD, comfortable in exam room.  Left knee: Mild effusion.  No fullness palpated posterior knee.  No bruising.  1+ pitting edema throughout lower leg to the level of the ankle.  Minimal redness distal anterior shin. Tenderness to palpation medial joint line and throughout lower leg below the knee. Range of motion 0-90 degrees with 5 out of 5 strength. Negative anterior posterior drawers.  Negative valgus and varus testing. Neurovascularly intact distally.    MSK u/s: Venous structures compressible posterior knee and in the calf.  Minimal effusion noted.  Again noted very small Baker's cyst in the popliteal fossa with significant soft tissue swelling below the level of the knee without neovascularity superficially.   Assessment & Plan:  1.  Left leg pain -advised patient that her current presentation is consistent with her ruptured Baker's cyst causing swelling below this level.  No evidence of DVT, infection currently but advised that we should go ahead with Keflex 500 mg 4 times daily as a precaution.  Stressed importance of using ice instead of heat, elevation, and compression to help with the swelling.  She will continue to take Aleve twice a day and she has oxycodone to take as needed for severe pain.  She is already had intra-articular cortisone injection that still may provide her with some relief though some of this likely need out along with her Baker's cyst rupture.  She asked about another advised that this was not likely to provide her relief and I would not recommend repeating it this early.  Follow-up in 1 week.

## 2017-09-24 NOTE — Progress Notes (Signed)
Y 

## 2017-09-24 NOTE — Patient Instructions (Signed)
The pain and increased swelling you have in your leg are because this bakers cyst has ruptured. Ice the area 15 minutes at a time at least 3-4 times a day.  Stop using heat as this causes more swelling. Compression is important to push the fluid out of the area (compression stockings or ACE wrap). Elevate above the level of your heart when possible. Take aleve twice a day with food for pain and inflammation. Take your oxycodone as needed for severe pain. I have sent in keflex as a precaution in case you're developing an infection - take this 4 times a day for the next 7 days. Follow up with me in 1 week.

## 2017-09-25 NOTE — Assessment & Plan Note (Signed)
advised patient that her current presentation is consistent with her ruptured Baker's cyst causing swelling below this level.  No evidence of DVT, infection currently but advised that we should go ahead with Keflex 500 mg 4 times daily as a precaution.  Stressed importance of using ice instead of heat, elevation, and compression to help with the swelling.  She will continue to take Aleve twice a day and she has oxycodone to take as needed for severe pain.  She is already had intra-articular cortisone injection that still may provide her with some relief though some of this likely need out along with her Baker's cyst rupture.  She asked about another advised that this was not likely to provide her relief and I would not recommend repeating it this early.  Follow-up in 1 week.

## 2017-09-27 ENCOUNTER — Telehealth: Payer: Self-pay | Admitting: Family Medicine

## 2017-09-27 NOTE — Telephone Encounter (Signed)
She would contact Dr. Trula Ore who normally prescribes this for her.

## 2017-09-27 NOTE — Telephone Encounter (Signed)
Patient requesting Oxycodone Rx. States at her last office visit she said she had medication left, but she states she is all out.   Pharmacy is the [facility] in [place]

## 2017-09-27 NOTE — Telephone Encounter (Signed)
Patient was informed.

## 2017-10-01 ENCOUNTER — Ambulatory Visit: Payer: Medicare Other | Admitting: Family Medicine

## 2017-10-02 ENCOUNTER — Ambulatory Visit: Payer: Medicare Other | Admitting: Family Medicine

## 2017-10-04 ENCOUNTER — Encounter: Payer: Self-pay | Admitting: Family Medicine

## 2017-10-04 ENCOUNTER — Ambulatory Visit (INDEPENDENT_AMBULATORY_CARE_PROVIDER_SITE_OTHER): Payer: Medicare Other | Admitting: Family Medicine

## 2017-10-04 ENCOUNTER — Ambulatory Visit (HOSPITAL_BASED_OUTPATIENT_CLINIC_OR_DEPARTMENT_OTHER)
Admission: RE | Admit: 2017-10-04 | Discharge: 2017-10-04 | Disposition: A | Discharge status: Home / Self Care | Payer: Medicare Other | Source: Ambulatory Visit | Attending: Family Medicine | Admitting: Family Medicine

## 2017-10-04 VITALS — BP 118/68 | HR 83 | Ht 62.0 in | Wt 175.0 lb

## 2017-10-04 DIAGNOSIS — M25562 Pain in left knee: Secondary | ICD-10-CM | POA: Insufficient documentation

## 2017-10-04 DIAGNOSIS — R600 Localized edema: Secondary | ICD-10-CM | POA: Diagnosis not present

## 2017-10-05 MED ORDER — FLUCONAZOLE 150 MG PO TABS: 150.0000 mg | ORAL_TABLET | Freq: Every day | ORAL | 0 refills | Status: DC

## 2017-10-08 ENCOUNTER — Encounter: Payer: Self-pay | Admitting: Family Medicine

## 2017-10-08 NOTE — Assessment & Plan Note (Signed)
presentation still consistent with ruptured Baker's cyst causing swelling of her lower leg which is also causing her pain.  Repeated Doppler ultrasound today which was negative for a DVT.  She is completed a course of Keflex will also prescribe her one Diflucan.  Again we stressed the importance of elevation, icing, and compression otherwise she will continue to have pressure and pain below the level of her knee.  We will go ahead with a wound consult for her edema for additional treatment.  She will get a BMP to assess her kidney function and potassium before increasing her dose of Lasix.  We will have plans to repeat this in 1-2 weeks also.

## 2017-10-08 NOTE — Progress Notes (Signed)
PCP: Patient, No Pcp Per  Subjective:   HPI: Patient is a 72 y.o. female here for left leg pain.  3/19: Patient reports about 10 days ago she started to get a sharp pain within her left knee both medially and posteriorly. She tried Tylenol without much benefit. She has had to use a cane the pain is worse with walking. She reports having a history of peripheral neuropathy and has had shots in her neck and back. She had a Doppler ultrasound of her left leg which showed no evidence of a DVT but did show evidence of Baker's cyst. Denies prior problems with her left knee. Pain level is a 10 out of 10 currently and sharp. No skin changes or numbness.  3/25: Patient returns reporting increased pain and swelling in her left lower leg. She denies any fevers, chills, or sweats. She does feel that the knee does feel warmer however. She has been using heat to the area and taking Aleve. She states she does not like to elevate it because it hurts worse. Pain level is 10 out of 10 at worst and sharp. Minimal redness anterior lower leg, no numbness.  4/4: Patient reports that she feels about the same or worse compared to last visit. States she is having spasms in her calf and 10 out of 10 level of pain in her lower leg. She is taking oxycodone, Cymbalta. Still with swelling. She does take triamterene and Lasix. She is walking with a cane. She still not elevating because she states that this increases the pain. She is not using anything for compression. She tolerated the Keflex but reports she believes she has a yeast infection and would like medication for this. No other skin changes, numbness.  Past Medical History:  Diagnosis Date  . Depression   . Deviated septum   . Diverticulitis 2014  . Hearing loss   . Sinus drainage   . Skin cancer   . Sleep apnea     Current Outpatient Medications on File Prior to Visit  Medication Sig Dispense Refill  . albuterol (PROVENTIL HFA;VENTOLIN  HFA) 108 (90 BASE) MCG/ACT inhaler Inhale 2 puffs into the lungs every 6 (six) hours as needed for wheezing.    Marland Kitchen alendronate (FOSAMAX) 70 MG tablet Take 70 mg by mouth every 30 (thirty) days. Take with a full glass of water on an empty stomach on Sundays     . ALPRAZolam (XANAX) 0.5 MG tablet Take 0.5 mg by mouth 2 (two) times daily.    . Armodafinil (NUVIGIL) 250 MG tablet Take 250 mg by mouth daily.    Marland Kitchen aspirin 81 MG tablet Take 81 mg by mouth every morning. Take an additional tablet if needed for leg cramps in the evening    . atorvastatin (LIPITOR) 10 MG tablet Take 10 mg by mouth daily.    . Bilberry 1000 MG CAPS Take 1 tablet by mouth every morning.     . budesonide-formoterol (SYMBICORT) 160-4.5 MCG/ACT inhaler Inhale 2 puffs into the lungs daily as needed (for wheezing).    Marland Kitchen buPROPion (WELLBUTRIN SR) 150 MG 12 hr tablet Take 150 mg by mouth 2 (two) times daily.    . cephALEXin (KEFLEX) 500 MG capsule Take 1 capsule (500 mg total) by mouth 4 (four) times daily. 28 capsule 0  . cholecalciferol (VITAMIN D) 1000 UNITS tablet Take 1,000 Units by mouth every morning.    . docusate sodium (COLACE) 100 MG capsule Take 1 capsule (100 mg total) by  mouth every 12 (twelve) hours. 60 capsule 0  . DULoxetine (CYMBALTA) 60 MG capsule Take 60 mg by mouth daily.    . fexofenadine (ALLEGRA) 180 MG tablet TAKE 1 TABLET (180 MG TOTAL) BY MOUTH DAILY.  2  . fluticasone (FLONASE) 50 MCG/ACT nasal spray Place 1 spray into both nostrils daily. 16 g 2  . furosemide (LASIX) 20 MG tablet Take 20 mg by mouth.    . lidocaine (LIDODERM) 5 % Place 1 patch onto the skin daily. Remove & Discard patch within 12 hours or as directed by MD 30 patch 0  . Magnesium 500 MG TABS Take 1 tablet by mouth every morning.     . Multiple Vitamin (MULTIVITAMIN WITH MINERALS) TABS Take 1 tablet by mouth every morning.    . Olopatadine HCl 0.2 % SOLN     . oxyCODONE (OXY IR/ROXICODONE) 5 MG immediate release tablet TAKE 1 TABLET BY  MOUTH 3 TIMES A DAY AS NEEDED FOR 30 DAYS  0  . pantoprazole (PROTONIX) 40 MG tablet Take 1 tablet (40 mg total) by mouth daily. 30 tablet 0  . primidone (MYSOLINE) 50 MG tablet PLEASE SEE ATTACHED FOR DETAILED DIRECTIONS  6  . sodium chloride (MURO 128) 2 % ophthalmic solution Place 1 drop into both eyes every morning.    . solifenacin (VESICARE) 10 MG tablet Take by mouth daily.    Marland Kitchen tiZANidine (ZANAFLEX) 4 MG tablet Take 4 mg by mouth every 6 (six) hours as needed for muscle spasms.    Marland Kitchen tolterodine (DETROL) 2 MG tablet Take 2 mg by mouth 2 (two) times daily.    . traMADol (ULTRAM) 50 MG tablet Take 50 mg by mouth every 8 (eight) hours as needed for pain.     Marland Kitchen triamterene-hydrochlorothiazide (DYAZIDE) 37.5-25 MG per capsule Take 1 capsule by mouth daily.    . vitamin B-12 (CYANOCOBALAMIN) 1000 MCG tablet Take 1,000 mcg by mouth every morning.      No current facility-administered medications on file prior to visit.     Past Surgical History:  Procedure Laterality Date  . APPENDECTOMY    . BACK SURGERY    . BREAST SURGERY    . CESAREAN SECTION    . EYE SURGERY    . FOOT SURGERY    . FOOT SURGERY    . FOOT SURGERY Left   . MOHS SURGERY    . NECK SURGERY      Allergies  Allergen Reactions  . Doxycycline   . Gabapentin   . Other     Eye drop  . Tape Hives    Pt can tolerate paper tape    Social History   Socioeconomic History  . Marital status: Divorced    Spouse name: Not on file  . Number of children: Not on file  . Years of education: Not on file  . Highest education level: Not on file  Occupational History  . Not on file  Social Needs  . Financial resource strain: Not on file  . Food insecurity:    Worry: Not on file    Inability: Not on file  . Transportation needs:    Medical: Not on file    Non-medical: Not on file  Tobacco Use  . Smoking status: Current Every Day Smoker    Types: Cigarettes  . Smokeless tobacco: Never Used  Substance and Sexual  Activity  . Alcohol use: No  . Drug use: No  . Sexual activity: Not on file  Lifestyle  . Physical activity:    Days per week: Not on file    Minutes per session: Not on file  . Stress: Not on file  Relationships  . Social connections:    Talks on phone: Not on file    Gets together: Not on file    Attends religious service: Not on file    Active member of club or organization: Not on file    Attends meetings of clubs or organizations: Not on file    Relationship status: Not on file  . Intimate partner violence:    Fear of current or ex partner: Not on file    Emotionally abused: Not on file    Physically abused: Not on file    Forced sexual activity: Not on file  Other Topics Concern  . Not on file  Social History Narrative  . Not on file    Family History  Problem Relation Age of Onset  . Kidney failure Mother     BP 118/68   Pulse 83   Ht 5\' 2"  (1.575 m)   Wt 175 lb (79.4 kg)   BMI 32.01 kg/m   Review of Systems: See HPI above.     Objective:  Physical Exam:  Gen: NAD, comfortable in exam room.  Left leg: Minimal effusion.  No fullness palpated posterior knee.  No bruising.  2+ pitting edema throughout lower leg below the knee.  No redness distally. Tenderness to palpation throughout lower leg. Range of motion 0-120 degrees of the knee without pain.  5 out of 5 strength. Neurovascularly intact distally.  Assessment & Plan:  1.  Left leg pain -presentation still consistent with ruptured Baker's cyst causing swelling of her lower leg which is also causing her pain.  Repeated Doppler ultrasound today which was negative for a DVT.  She is completed a course of Keflex will also prescribe her one Diflucan.  Again we stressed the importance of elevation, icing, and compression otherwise she will continue to have pressure and pain below the level of her knee.  We will go ahead with a wound consult for her edema for additional treatment.  She will get a BMP to assess  her kidney function and potassium before increasing her dose of Lasix.  We will have plans to repeat this in 1-2 weeks also.

## 2017-10-10 DIAGNOSIS — M25562 Pain in left knee: Secondary | ICD-10-CM | POA: Diagnosis not present

## 2017-10-11 LAB — BASIC METABOLIC PANEL
BUN/Creatinine Ratio: 11 — ABNORMAL LOW (ref 12–28)
BUN: 8 mg/dL (ref 8–27)
CALCIUM: 9.4 mg/dL (ref 8.7–10.3)
CHLORIDE: 98 mmol/L (ref 96–106)
CO2: 27 mmol/L (ref 20–29)
Creatinine, Ser: 0.75 mg/dL (ref 0.57–1.00)
GFR calc non Af Amer: 80 mL/min/{1.73_m2} (ref >59)
GFR, EST AFRICAN AMERICAN: 93 mL/min/{1.73_m2} (ref >59)
GLUCOSE: 108 mg/dL — AB (ref 65–99)
POTASSIUM: 3.4 mmol/L — AB (ref 3.5–5.2)
Sodium: 140 mmol/L (ref 134–144)

## 2017-10-11 MED ORDER — POTASSIUM CHLORIDE ER 20 MEQ PO TBCR: 1.0000 | EXTENDED_RELEASE_TABLET | Freq: Every day | ORAL | 1 refills | Status: AC

## 2017-10-11 NOTE — Addendum Note (Signed)
Addended by: Sherrie George F on: 10/11/2017 01:57 PM   Modules accepted: Orders

## 2017-10-11 NOTE — Addendum Note (Signed)
Addended by: Dene Gentry on: 10/11/2017 04:01 PM   Modules accepted: Orders

## 2017-10-17 DIAGNOSIS — M5441 Lumbago with sciatica, right side: Secondary | ICD-10-CM | POA: Diagnosis not present

## 2017-10-17 DIAGNOSIS — F5101 Primary insomnia: Secondary | ICD-10-CM | POA: Diagnosis not present

## 2017-10-17 DIAGNOSIS — M542 Cervicalgia: Secondary | ICD-10-CM | POA: Diagnosis not present

## 2017-10-17 DIAGNOSIS — M5442 Lumbago with sciatica, left side: Secondary | ICD-10-CM | POA: Diagnosis not present

## 2017-10-17 DIAGNOSIS — G250 Essential tremor: Secondary | ICD-10-CM | POA: Diagnosis not present

## 2017-10-18 ENCOUNTER — Ambulatory Visit: Payer: Medicare Other | Admitting: Family Medicine

## 2017-10-24 NOTE — Addendum Note (Signed)
Addended by: Sherrie George F on: 10/24/2017 08:50 AM   Modules accepted: Orders

## 2017-11-07 DIAGNOSIS — M25562 Pain in left knee: Secondary | ICD-10-CM | POA: Diagnosis not present

## 2017-11-07 DIAGNOSIS — N3941 Urge incontinence: Secondary | ICD-10-CM | POA: Diagnosis not present

## 2017-11-08 LAB — BASIC METABOLIC PANEL
BUN/Creatinine Ratio: 15 (ref 12–28)
BUN: 11 mg/dL (ref 8–27)
CALCIUM: 8.9 mg/dL (ref 8.7–10.3)
CHLORIDE: 98 mmol/L (ref 96–106)
CO2: 29 mmol/L (ref 20–29)
Creatinine, Ser: 0.75 mg/dL (ref 0.57–1.00)
GFR calc Af Amer: 92 mL/min/{1.73_m2} (ref >59)
GFR calc non Af Amer: 80 mL/min/{1.73_m2} (ref >59)
Glucose: 87 mg/dL (ref 65–99)
Potassium: 4 mmol/L (ref 3.5–5.2)
Sodium: 140 mmol/L (ref 134–144)

## 2017-11-22 ENCOUNTER — Ambulatory Visit: Payer: Medicare Other | Admitting: Family Medicine

## 2017-11-23 ENCOUNTER — Ambulatory Visit: Payer: Medicare Other | Admitting: Family Medicine

## 2017-11-28 DIAGNOSIS — R2689 Other abnormalities of gait and mobility: Secondary | ICD-10-CM | POA: Diagnosis not present

## 2017-11-28 DIAGNOSIS — R29898 Other symptoms and signs involving the musculoskeletal system: Secondary | ICD-10-CM | POA: Diagnosis not present

## 2017-11-28 DIAGNOSIS — M25662 Stiffness of left knee, not elsewhere classified: Secondary | ICD-10-CM | POA: Diagnosis not present

## 2017-11-28 DIAGNOSIS — M25562 Pain in left knee: Secondary | ICD-10-CM | POA: Diagnosis not present

## 2017-11-29 ENCOUNTER — Encounter: Payer: Self-pay | Admitting: Family Medicine

## 2017-11-29 ENCOUNTER — Ambulatory Visit (INDEPENDENT_AMBULATORY_CARE_PROVIDER_SITE_OTHER): Payer: Medicare Other | Admitting: Family Medicine

## 2017-11-29 DIAGNOSIS — M79605 Pain in left leg: Secondary | ICD-10-CM

## 2017-11-29 MED ORDER — METHYLPREDNISOLONE ACETATE 40 MG/ML IJ SUSP
40.0000 mg | Freq: Once | INTRAMUSCULAR | Status: AC
  Administered 2017-11-29: 40 mg via INTRA_ARTICULAR

## 2017-11-29 NOTE — Patient Instructions (Signed)
We repeated your injection today. Continue therapy and do home exercises on days you don't go to therapy. Ice the area 15 minutes at a time at least 3-4 times a day.   Compression is important to push the fluid out of the area (compression stockings or ACE wrap). Elevate above the level of your heart when possible. Take aleve twice a day with food for pain and inflammation as needed. Follow up with me in 1 week.

## 2017-11-30 DIAGNOSIS — R29898 Other symptoms and signs involving the musculoskeletal system: Secondary | ICD-10-CM | POA: Diagnosis not present

## 2017-11-30 DIAGNOSIS — R2689 Other abnormalities of gait and mobility: Secondary | ICD-10-CM | POA: Diagnosis not present

## 2017-11-30 DIAGNOSIS — M25662 Stiffness of left knee, not elsewhere classified: Secondary | ICD-10-CM | POA: Diagnosis not present

## 2017-11-30 DIAGNOSIS — M25562 Pain in left knee: Secondary | ICD-10-CM | POA: Diagnosis not present

## 2017-12-02 ENCOUNTER — Encounter: Payer: Self-pay | Admitting: Family Medicine

## 2017-12-02 NOTE — Assessment & Plan Note (Signed)
2/2 ruptured baker's cyst causing lower extremity swelling, pain.  Doppler x 2 negative for DVT.  Stressed elevation, compression, icing.  Aleve twice a day as needed.  Repeated intraarticular injection into knee but discussed with ruptured cyst much of injection will leak out and importance is treating her swelling, continuing PT for lymphedema.  F/u in 1 month.  After informed written consent timeout was performed, patient was lying supine on exam table. Left knee was prepped with alcohol swab and utilizing superolateral approach with ultrasound guidance, patient's left knee was injected intraarticularly with 3:1 bupivicaine: depomedrol. Patient tolerated the procedure well without immediate complications.

## 2017-12-02 NOTE — Progress Notes (Signed)
PCP: Patient, No Pcp Per  Subjective:   HPI: Patient is a 72 y.o. female here for left leg pain.  3/19: Patient reports about 10 days ago she started to get a sharp pain within her left knee both medially and posteriorly. She tried Tylenol without much benefit. She has had to use a cane the pain is worse with walking. She reports having a history of peripheral neuropathy and has had shots in her neck and back. She had a Doppler ultrasound of her left leg which showed no evidence of a DVT but did show evidence of Baker's cyst. Denies prior problems with her left knee. Pain level is a 10 out of 10 currently and sharp. No skin changes or numbness.  3/25: Patient returns reporting increased pain and swelling in her left lower leg. She denies any fevers, chills, or sweats. She does feel that the knee does feel warmer however. She has been using heat to the area and taking Aleve. She states she does not like to elevate it because it hurts worse. Pain level is 10 out of 10 at worst and sharp. Minimal redness anterior lower leg, no numbness.  4/4: Patient reports that she feels about the same or worse compared to last visit. States she is having spasms in her calf and 10 out of 10 level of pain in her lower leg. She is taking oxycodone, Cymbalta. Still with swelling. She does take triamterene and Lasix. She is walking with a cane. She still not elevating because she states that this increases the pain. She is not using anything for compression. She tolerated the Keflex but reports she believes she has a yeast infection and would like medication for this. No other skin changes, numbness.  5/30: Patient reports her pain is 5/10 but states she is 'not doing well' Pain still in left knee and leg anteriorly with swelling though swelling has improved. Pain worse with walking and by end of day. Just started therapy but only had one visit. No new injuries, skin changes.  Past Medical  History:  Diagnosis Date  . Depression   . Deviated septum   . Diverticulitis 2014  . Hearing loss   . Left knee pain 09/20/2017  . Sinus drainage   . Skin cancer   . Sleep apnea     Current Outpatient Medications on File Prior to Visit  Medication Sig Dispense Refill  . albuterol (PROVENTIL HFA;VENTOLIN HFA) 108 (90 BASE) MCG/ACT inhaler Inhale 2 puffs into the lungs every 6 (six) hours as needed for wheezing.    Marland Kitchen alendronate (FOSAMAX) 70 MG tablet Take 70 mg by mouth every 30 (thirty) days. Take with a full glass of water on an empty stomach on Sundays     . ALPRAZolam (XANAX) 0.5 MG tablet Take 0.5 mg by mouth 2 (two) times daily.    . Armodafinil (NUVIGIL) 250 MG tablet Take 250 mg by mouth daily.    Marland Kitchen aspirin 81 MG tablet Take 81 mg by mouth every morning. Take an additional tablet if needed for leg cramps in the evening    . atorvastatin (LIPITOR) 10 MG tablet Take 10 mg by mouth daily.    . Bilberry 1000 MG CAPS Take 1 tablet by mouth every morning.     . budesonide-formoterol (SYMBICORT) 160-4.5 MCG/ACT inhaler Inhale 2 puffs into the lungs daily as needed (for wheezing).    Marland Kitchen buPROPion (WELLBUTRIN SR) 150 MG 12 hr tablet Take 150 mg by mouth 2 (two) times  daily.    . cephALEXin (KEFLEX) 500 MG capsule Take 1 capsule (500 mg total) by mouth 4 (four) times daily. 28 capsule 0  . cholecalciferol (VITAMIN D) 1000 UNITS tablet Take 1,000 Units by mouth every morning.    . docusate sodium (COLACE) 100 MG capsule Take 1 capsule (100 mg total) by mouth every 12 (twelve) hours. 60 capsule 0  . DULoxetine (CYMBALTA) 60 MG capsule Take 60 mg by mouth daily.    . fexofenadine (ALLEGRA) 180 MG tablet TAKE 1 TABLET (180 MG TOTAL) BY MOUTH DAILY.  2  . fluconazole (DIFLUCAN) 150 MG tablet Take 1 tablet (150 mg total) by mouth daily. 1 tablet 0  . fluticasone (FLONASE) 50 MCG/ACT nasal spray Place 1 spray into both nostrils daily. 16 g 2  . furosemide (LASIX) 20 MG tablet Take 20 mg by mouth.     . lidocaine (LIDODERM) 5 % Place 1 patch onto the skin daily. Remove & Discard patch within 12 hours or as directed by MD 30 patch 0  . Magnesium 500 MG TABS Take 1 tablet by mouth every morning.     . Multiple Vitamin (MULTIVITAMIN WITH MINERALS) TABS Take 1 tablet by mouth every morning.    . Olopatadine HCl 0.2 % SOLN     . oxyCODONE (OXY IR/ROXICODONE) 5 MG immediate release tablet TAKE 1 TABLET BY MOUTH 3 TIMES A DAY AS NEEDED FOR 30 DAYS  0  . pantoprazole (PROTONIX) 40 MG tablet Take 1 tablet (40 mg total) by mouth daily. 30 tablet 0  . Potassium Chloride ER 20 MEQ TBCR Take 1 tablet by mouth daily. 30 tablet 1  . primidone (MYSOLINE) 50 MG tablet PLEASE SEE ATTACHED FOR DETAILED DIRECTIONS  6  . sodium chloride (MURO 128) 2 % ophthalmic solution Place 1 drop into both eyes every morning.    . solifenacin (VESICARE) 10 MG tablet Take by mouth daily.    Marland Kitchen tiZANidine (ZANAFLEX) 4 MG tablet Take 4 mg by mouth every 6 (six) hours as needed for muscle spasms.    Marland Kitchen tolterodine (DETROL) 2 MG tablet Take 2 mg by mouth 2 (two) times daily.    . traMADol (ULTRAM) 50 MG tablet Take 50 mg by mouth every 8 (eight) hours as needed for pain.     Marland Kitchen triamterene-hydrochlorothiazide (DYAZIDE) 37.5-25 MG per capsule Take 1 capsule by mouth daily.    . vitamin B-12 (CYANOCOBALAMIN) 1000 MCG tablet Take 1,000 mcg by mouth every morning.      No current facility-administered medications on file prior to visit.     Past Surgical History:  Procedure Laterality Date  . APPENDECTOMY    . BACK SURGERY    . BREAST SURGERY    . CESAREAN SECTION    . EYE SURGERY    . FOOT SURGERY    . FOOT SURGERY    . FOOT SURGERY Left   . MOHS SURGERY    . NECK SURGERY      Allergies  Allergen Reactions  . Doxycycline   . Gabapentin   . Other     Eye drop  . Tape Hives    Pt can tolerate paper tape    Social History   Socioeconomic History  . Marital status: Divorced    Spouse name: Not on file  . Number  of children: Not on file  . Years of education: Not on file  . Highest education level: Not on file  Occupational History  . Not on  file  Social Needs  . Financial resource strain: Not on file  . Food insecurity:    Worry: Not on file    Inability: Not on file  . Transportation needs:    Medical: Not on file    Non-medical: Not on file  Tobacco Use  . Smoking status: Current Every Day Smoker    Types: Cigarettes  . Smokeless tobacco: Never Used  Substance and Sexual Activity  . Alcohol use: No  . Drug use: No  . Sexual activity: Not on file  Lifestyle  . Physical activity:    Days per week: Not on file    Minutes per session: Not on file  . Stress: Not on file  Relationships  . Social connections:    Talks on phone: Not on file    Gets together: Not on file    Attends religious service: Not on file    Active member of club or organization: Not on file    Attends meetings of clubs or organizations: Not on file    Relationship status: Not on file  . Intimate partner violence:    Fear of current or ex partner: Not on file    Emotionally abused: Not on file    Physically abused: Not on file    Forced sexual activity: Not on file  Other Topics Concern  . Not on file  Social History Narrative  . Not on file    Family History  Problem Relation Age of Onset  . Kidney failure Mother     BP 127/68   Pulse 76   Ht 5\' 2"  (1.575 m)   Wt 173 lb (78.5 kg)   BMI 31.64 kg/m   Review of Systems: See HPI above.     Objective:  Physical Exam:  Gen: NAD, comfortable in exam room  Left leg: Mild effusion.  No other deformity.  No fullness posterior knee.  No bruising.  1+ pitting edema lower leg.  No redness, warmth. Tenderness throughout lower leg including joint lines from knee down to ankle. ROM 0 - 120 degrees without pain, 5/5 strength. NVI distally.  Assessment & Plan:  1.  Left leg pain - 2/2 ruptured baker's cyst causing lower extremity swelling, pain.   Doppler x 2 negative for DVT.  Stressed elevation, compression, icing.  Aleve twice a day as needed.  Repeated intraarticular injection into knee but discussed with ruptured cyst much of injection will leak out and importance is treating her swelling, continuing PT for lymphedema.  F/u in 1 month.  After informed written consent timeout was performed, patient was lying supine on exam table. Left knee was prepped with alcohol swab and utilizing superolateral approach with ultrasound guidance, patient's left knee was injected intraarticularly with 3:1 bupivicaine: depomedrol. Patient tolerated the procedure well without immediate complications.

## 2017-12-03 DIAGNOSIS — M25562 Pain in left knee: Secondary | ICD-10-CM | POA: Diagnosis not present

## 2017-12-03 DIAGNOSIS — R29898 Other symptoms and signs involving the musculoskeletal system: Secondary | ICD-10-CM | POA: Diagnosis not present

## 2017-12-03 DIAGNOSIS — M25662 Stiffness of left knee, not elsewhere classified: Secondary | ICD-10-CM | POA: Diagnosis not present

## 2017-12-03 DIAGNOSIS — R2689 Other abnormalities of gait and mobility: Secondary | ICD-10-CM | POA: Diagnosis not present

## 2017-12-05 ENCOUNTER — Ambulatory Visit: Payer: Medicare Other | Admitting: Family Medicine

## 2017-12-10 DIAGNOSIS — M25662 Stiffness of left knee, not elsewhere classified: Secondary | ICD-10-CM | POA: Diagnosis not present

## 2017-12-10 DIAGNOSIS — R29898 Other symptoms and signs involving the musculoskeletal system: Secondary | ICD-10-CM | POA: Diagnosis not present

## 2017-12-10 DIAGNOSIS — R2689 Other abnormalities of gait and mobility: Secondary | ICD-10-CM | POA: Diagnosis not present

## 2017-12-10 DIAGNOSIS — M25562 Pain in left knee: Secondary | ICD-10-CM | POA: Diagnosis not present

## 2017-12-12 DIAGNOSIS — M25662 Stiffness of left knee, not elsewhere classified: Secondary | ICD-10-CM | POA: Diagnosis not present

## 2017-12-12 DIAGNOSIS — M25562 Pain in left knee: Secondary | ICD-10-CM | POA: Diagnosis not present

## 2017-12-12 DIAGNOSIS — R2689 Other abnormalities of gait and mobility: Secondary | ICD-10-CM | POA: Diagnosis not present

## 2017-12-12 DIAGNOSIS — R29898 Other symptoms and signs involving the musculoskeletal system: Secondary | ICD-10-CM | POA: Diagnosis not present

## 2017-12-19 DIAGNOSIS — M25562 Pain in left knee: Secondary | ICD-10-CM | POA: Diagnosis not present

## 2017-12-19 DIAGNOSIS — R29898 Other symptoms and signs involving the musculoskeletal system: Secondary | ICD-10-CM | POA: Diagnosis not present

## 2017-12-19 DIAGNOSIS — M25662 Stiffness of left knee, not elsewhere classified: Secondary | ICD-10-CM | POA: Diagnosis not present

## 2017-12-19 DIAGNOSIS — R2689 Other abnormalities of gait and mobility: Secondary | ICD-10-CM | POA: Diagnosis not present

## 2017-12-25 ENCOUNTER — Encounter: Payer: Self-pay | Admitting: Family Medicine

## 2017-12-25 ENCOUNTER — Ambulatory Visit (INDEPENDENT_AMBULATORY_CARE_PROVIDER_SITE_OTHER): Payer: Medicare Other | Admitting: Family Medicine

## 2017-12-25 DIAGNOSIS — M79605 Pain in left leg: Secondary | ICD-10-CM

## 2017-12-25 NOTE — Patient Instructions (Signed)
Continue therapy and do home exercises on days you don't go to therapy - transition to just home exercises when you feel comfortable doing these. Ice the area 15 minutes at a time at least 3-4 times a day.   Compression is important to push the fluid out of the area (compression stockings or ACE wrap). Elevate above the level of your heart when possible. Take aleve twice a day with food for pain and inflammation as needed. You should establish with a primary care physician.

## 2017-12-26 ENCOUNTER — Encounter: Payer: Self-pay | Admitting: Family Medicine

## 2017-12-26 NOTE — Assessment & Plan Note (Signed)
again most of pain is caused by patient's soft tissue swelling related to ruptured Baker's cyst and likely underlying chronic venous insufficiency.  She has had 2 Doppler ultrasounds that were negative for DVT and today there is not concern for DVT based on exam and ultrasound.  Unfortunately she is not doing elevation or compression which would help with the swelling and her pain.  2 intra-articular cortisone injections have not helped and advised that would not repeat this again.  She will continue with therapy and home exercises.  Encouraged her that if she would like the pain and swelling to go away she needs to ice, compress, and elevate this.  She was also advised to establish care with a primary care physician.  Discussed I do not think there is anything further that we can do to help her with the swelling or pain.  She should follow-up with [place] as needed.

## 2017-12-26 NOTE — Progress Notes (Signed)
PCP: Patient, No Pcp Per  Subjective:   HPI: Patient is a 72 y.o. female here for left leg pain.  3/19: Patient reports about 10 days ago she started to get a sharp pain within her left knee both medially and posteriorly. She tried Tylenol without much benefit. She has had to use a cane the pain is worse with walking. She reports having a history of peripheral neuropathy and has had shots in her neck and back. She had a Doppler ultrasound of her left leg which showed no evidence of a DVT but did show evidence of Baker's cyst. Denies prior problems with her left knee. Pain level is a 10 out of 10 currently and sharp. No skin changes or numbness.  3/25: Patient returns reporting increased pain and swelling in her left lower leg. She denies any fevers, chills, or sweats. She does feel that the knee does feel warmer however. She has been using heat to the area and taking Aleve. She states she does not like to elevate it because it hurts worse. Pain level is 10 out of 10 at worst and sharp. Minimal redness anterior lower leg, no numbness.  4/4: Patient reports that she feels about the same or worse compared to last visit. States she is having spasms in her calf and 10 out of 10 level of pain in her lower leg. She is taking oxycodone, Cymbalta. Still with swelling. She does take triamterene and Lasix. She is walking with a cane. She still not elevating because she states that this increases the pain. She is not using anything for compression. She tolerated the Keflex but reports she believes she has a yeast infection and would like medication for this. No other skin changes, numbness.  5/30: Patient reports her pain is 5/10 but states she is 'not doing well' Pain still in left knee and leg anteriorly with swelling though swelling has improved. Pain worse with walking and by end of day. Just started therapy but only had one visit. No new injuries, skin changes.  6/25: Patient  reports the injection repeated last visit helped her but she also is reporting 10/10 level pain around her knee and lower leg anteriorly into her calf and hamstring. She is still not elevating, using anything for compression as she states these make her pain worse. Is taking lasix. Taking aleve, doing physical therapy with TENS unit - noting help with this. Using cane. Feels like leg will give out at times. No skin changes, new injuries.  Past Medical History:  Diagnosis Date  . Depression   . Deviated septum   . Diverticulitis 2014  . Hearing loss   . Left knee pain 09/20/2017  . Sinus drainage   . Skin cancer   . Sleep apnea     Current Outpatient Medications on File Prior to Visit  Medication Sig Dispense Refill  . albuterol (PROVENTIL HFA;VENTOLIN HFA) 108 (90 BASE) MCG/ACT inhaler Inhale 2 puffs into the lungs every 6 (six) hours as needed for wheezing.    Marland Kitchen alendronate (FOSAMAX) 70 MG tablet Take 70 mg by mouth every 30 (thirty) days. Take with a full glass of water on an empty stomach on Sundays     . ALPRAZolam (XANAX) 0.5 MG tablet Take 0.5 mg by mouth 2 (two) times daily.    . Armodafinil (NUVIGIL) 250 MG tablet Take 250 mg by mouth daily.    Marland Kitchen aspirin 81 MG tablet Take 81 mg by mouth every morning. Take an additional tablet  if needed for leg cramps in the evening    . atorvastatin (LIPITOR) 10 MG tablet Take 10 mg by mouth daily.    . Bilberry 1000 MG CAPS Take 1 tablet by mouth every morning.     . budesonide-formoterol (SYMBICORT) 160-4.5 MCG/ACT inhaler Inhale 2 puffs into the lungs daily as needed (for wheezing).    Marland Kitchen buPROPion (WELLBUTRIN SR) 150 MG 12 hr tablet Take 150 mg by mouth 2 (two) times daily.    . cholecalciferol (VITAMIN D) 1000 UNITS tablet Take 1,000 Units by mouth every morning.    . docusate sodium (COLACE) 100 MG capsule Take 1 capsule (100 mg total) by mouth every 12 (twelve) hours. 60 capsule 0  . DULoxetine (CYMBALTA) 60 MG capsule Take 60 mg by  mouth daily.    . fexofenadine (ALLEGRA) 180 MG tablet TAKE 1 TABLET (180 MG TOTAL) BY MOUTH DAILY.  2  . fluticasone (FLONASE) 50 MCG/ACT nasal spray Place 1 spray into both nostrils daily. 16 g 2  . furosemide (LASIX) 20 MG tablet Take 20 mg by mouth.    . ibandronate (BONIVA) 150 MG tablet     . lidocaine (LIDODERM) 5 % Place 1 patch onto the skin daily. Remove & Discard patch within 12 hours or as directed by MD 30 patch 0  . Magnesium 500 MG TABS Take 1 tablet by mouth every morning.     . Multiple Vitamin (MULTIVITAMIN WITH MINERALS) TABS Take 1 tablet by mouth every morning.    Marland Kitchen MYRBETRIQ 50 MG TB24 tablet     . Olopatadine HCl 0.2 % SOLN     . oxyCODONE (OXY IR/ROXICODONE) 5 MG immediate release tablet TAKE 1 TABLET BY MOUTH 3 TIMES A DAY AS NEEDED FOR 30 DAYS  0  . pantoprazole (PROTONIX) 40 MG tablet Take 1 tablet (40 mg total) by mouth daily. 30 tablet 0  . Potassium Chloride ER 20 MEQ TBCR Take 1 tablet by mouth daily. 30 tablet 1  . primidone (MYSOLINE) 50 MG tablet PLEASE SEE ATTACHED FOR DETAILED DIRECTIONS  6  . sodium chloride (MURO 128) 2 % ophthalmic solution Place 1 drop into both eyes every morning.    . solifenacin (VESICARE) 10 MG tablet Take by mouth daily.    Marland Kitchen tiZANidine (ZANAFLEX) 4 MG tablet Take 4 mg by mouth every 6 (six) hours as needed for muscle spasms.    Marland Kitchen tolterodine (DETROL) 2 MG tablet Take 2 mg by mouth 2 (two) times daily.    Marland Kitchen triamterene-hydrochlorothiazide (DYAZIDE) 37.5-25 MG per capsule Take 1 capsule by mouth daily.    . vitamin B-12 (CYANOCOBALAMIN) 1000 MCG tablet Take 1,000 mcg by mouth every morning.      No current facility-administered medications on file prior to visit.     Past Surgical History:  Procedure Laterality Date  . APPENDECTOMY    . BACK SURGERY    . BREAST SURGERY    . CESAREAN SECTION    . EYE SURGERY    . FOOT SURGERY    . FOOT SURGERY    . FOOT SURGERY Left   . MOHS SURGERY    . NECK SURGERY      Allergies   Allergen Reactions  . Doxycycline   . Gabapentin   . Other     Eye drop  . Tape Hives    Pt can tolerate paper tape    Social History   Socioeconomic History  . Marital status: Divorced    Spouse  name: Not on file  . Number of children: Not on file  . Years of education: Not on file  . Highest education level: Not on file  Occupational History  . Not on file  Social Needs  . Financial resource strain: Not on file  . Food insecurity:    Worry: Not on file    Inability: Not on file  . Transportation needs:    Medical: Not on file    Non-medical: Not on file  Tobacco Use  . Smoking status: Current Every Day Smoker    Types: Cigarettes  . Smokeless tobacco: Never Used  Substance and Sexual Activity  . Alcohol use: No  . Drug use: No  . Sexual activity: Not on file  Lifestyle  . Physical activity:    Days per week: Not on file    Minutes per session: Not on file  . Stress: Not on file  Relationships  . Social connections:    Talks on phone: Not on file    Gets together: Not on file    Attends religious service: Not on file    Active member of club or organization: Not on file    Attends meetings of clubs or organizations: Not on file    Relationship status: Not on file  . Intimate partner violence:    Fear of current or ex partner: Not on file    Emotionally abused: Not on file    Physically abused: Not on file    Forced sexual activity: Not on file  Other Topics Concern  . Not on file  Social History Narrative  . Not on file    Family History  Problem Relation Age of Onset  . Kidney failure Mother     BP 113/69   Pulse 79   Ht 5\' 2"  (1.575 m)   Wt 175 lb (79.4 kg)   BMI 32.01 kg/m   Review of Systems: See HPI above.     Objective:  Physical Exam:  Gen: NAD, comfortable in exam room  Left leg: Minimal effusion.  No other deformity.  Minimal fullness posterior knee.  No bruising.  1-2+ pitting edema lower leg.  No redness or warmth. There  is to palpation throughout lower leg anteriorly without palpable cords posteriorly.  Tenderness includes medial lateral joint lines of the knee. Range of motion 0 to 120 degrees without pain, 5 out of 5 strength. Neurovascularly intact distally.  MSK u/s left leg: Soft tissue swelling again noted especially from mid lower leg down into the foot.  Venous structures compressible posteriorly including popliteal vein and tibial vein, saphenous vein.  Very small Baker's cyst visualized.  Assessment & Plan:  1.  Left leg pain -again most of pain is caused by patient's soft tissue swelling related to ruptured Baker's cyst and likely underlying chronic venous insufficiency.  She has had 2 Doppler ultrasounds that were negative for DVT and today there is not concern for DVT based on exam and ultrasound.  Unfortunately she is not doing elevation or compression which would help with the swelling and her pain.  2 intra-articular cortisone injections have not helped and advised that would not repeat this again.  She will continue with therapy and home exercises.  Encouraged her that if she would like the pain and swelling to go away she needs to ice, compress, and elevate this.  She was also advised to establish care with a primary care physician.  Discussed I do not think there is anything  further that we can do to help her with the swelling or pain.  She should follow-up with [place] as needed.

## 2018-01-07 DIAGNOSIS — M25562 Pain in left knee: Secondary | ICD-10-CM | POA: Diagnosis not present

## 2018-01-07 DIAGNOSIS — M25462 Effusion, left knee: Secondary | ICD-10-CM | POA: Diagnosis not present

## 2018-01-23 DIAGNOSIS — M25562 Pain in left knee: Secondary | ICD-10-CM | POA: Diagnosis not present

## 2018-01-23 DIAGNOSIS — M25462 Effusion, left knee: Secondary | ICD-10-CM | POA: Diagnosis not present

## 2018-01-28 DIAGNOSIS — M25562 Pain in left knee: Secondary | ICD-10-CM | POA: Diagnosis not present

## 2018-01-28 DIAGNOSIS — M25462 Effusion, left knee: Secondary | ICD-10-CM | POA: Diagnosis not present

## 2018-02-14 ENCOUNTER — Ambulatory Visit (INDEPENDENT_AMBULATORY_CARE_PROVIDER_SITE_OTHER): Payer: Medicare Other | Admitting: Family Medicine

## 2018-02-14 ENCOUNTER — Encounter: Payer: Self-pay | Admitting: Family Medicine

## 2018-02-14 VITALS — BP 109/70 | HR 80 | Ht 62.0 in | Wt 170.0 lb

## 2018-02-14 DIAGNOSIS — M25562 Pain in left knee: Secondary | ICD-10-CM | POA: Diagnosis not present

## 2018-02-14 DIAGNOSIS — M79671 Pain in right foot: Secondary | ICD-10-CM

## 2018-02-14 NOTE — Patient Instructions (Signed)
We will go ahead with an MRI of your left knee. Continue elevation, icing, aleve if needed.  Your right foot pain is due to peroneal tendinitis. Icing, aleve for this too. Arch supports are more beneficial for this. Do theraband strengthening with yellow band 3 sets of 10 once a day. I will contact you with MRi results and next steps.

## 2018-02-16 ENCOUNTER — Ambulatory Visit (HOSPITAL_BASED_OUTPATIENT_CLINIC_OR_DEPARTMENT_OTHER): Payer: Medicare Other

## 2018-02-18 ENCOUNTER — Encounter: Payer: Self-pay | Admitting: Family Medicine

## 2018-02-18 NOTE — Progress Notes (Signed)
PCP: Patient, No Pcp Per  Subjective:   HPI: Patient is a 72 y.o. female here for left leg pain, right foot pain.  3/19: Patient reports about 10 days ago she started to get a sharp pain within her left knee both medially and posteriorly. She tried Tylenol without much benefit. She has had to use a cane the pain is worse with walking. She reports having a history of peripheral neuropathy and has had shots in her neck and back. She had a Doppler ultrasound of her left leg which showed no evidence of a DVT but did show evidence of Baker's cyst. Denies prior problems with her left knee. Pain level is a 10 out of 10 currently and sharp. No skin changes or numbness.  3/25: Patient returns reporting increased pain and swelling in her left lower leg. She denies any fevers, chills, or sweats. She does feel that the knee does feel warmer however. She has been using heat to the area and taking Aleve. She states she does not like to elevate it because it hurts worse. Pain level is 10 out of 10 at worst and sharp. Minimal redness anterior lower leg, no numbness.  4/4: Patient reports that she feels about the same or worse compared to last visit. States she is having spasms in her calf and 10 out of 10 level of pain in her lower leg. She is taking oxycodone, Cymbalta. Still with swelling. She does take triamterene and Lasix. She is walking with a cane. She still not elevating because she states that this increases the pain. She is not using anything for compression. She tolerated the Keflex but reports she believes she has a yeast infection and would like medication for this. No other skin changes, numbness.  5/30: Patient reports her pain is 5/10 but states she is 'not doing well' Pain still in left knee and leg anteriorly with swelling though swelling has improved. Pain worse with walking and by end of day. Just started therapy but only had one visit. No new injuries, skin  changes.  6/25: Patient reports the injection repeated last visit helped her but she also is reporting 10/10 level pain around her knee and lower leg anteriorly into her calf and hamstring. She is still not elevating, using anything for compression as she states these make her pain worse. Is taking lasix. Taking aleve, doing physical therapy with TENS unit - noting help with this. Using cane. Feels like leg will give out at times. No skin changes, new injuries.  8/15: Patient returns with continued pain in left knee radiating down her leg. Pain level 5/10, sharp. Worse with prolonged sitting then going to get up. Has done physical therapy, home exercises.   Rarely taken aleve, iced the area. Injections have only helped a little. No skin changes. Getting pain lateral right ankle down into foot.  Past Medical History:  Diagnosis Date  . Depression   . Deviated septum   . Diverticulitis 2014  . Hearing loss   . Left knee pain 09/20/2017  . Sinus drainage   . Skin cancer   . Sleep apnea     Current Outpatient Medications on File Prior to Visit  Medication Sig Dispense Refill  . albuterol (PROVENTIL HFA;VENTOLIN HFA) 108 (90 BASE) MCG/ACT inhaler Inhale 2 puffs into the lungs every 6 (six) hours as needed for wheezing.    Marland Kitchen alendronate (FOSAMAX) 70 MG tablet Take 70 mg by mouth every 30 (thirty) days. Take with a full glass  of water on an empty stomach on Sundays     . ALPRAZolam (XANAX) 0.5 MG tablet Take 0.5 mg by mouth 2 (two) times daily.    . Armodafinil (NUVIGIL) 250 MG tablet Take 250 mg by mouth daily.    Marland Kitchen aspirin 81 MG tablet Take 81 mg by mouth every morning. Take an additional tablet if needed for leg cramps in the evening    . atorvastatin (LIPITOR) 20 MG tablet Take 20 mg by mouth at bedtime.  3  . Bilberry 1000 MG CAPS Take 1 tablet by mouth every morning.     . budesonide-formoterol (SYMBICORT) 160-4.5 MCG/ACT inhaler Inhale 2 puffs into the lungs daily as needed  (for wheezing).    Marland Kitchen buPROPion (WELLBUTRIN SR) 150 MG 12 hr tablet Take 150 mg by mouth 2 (two) times daily.    . cholecalciferol (VITAMIN D) 1000 UNITS tablet Take 1,000 Units by mouth every morning.    . docusate sodium (COLACE) 100 MG capsule Take 1 capsule (100 mg total) by mouth every 12 (twelve) hours. 60 capsule 0  . DULoxetine (CYMBALTA) 60 MG capsule Take 60 mg by mouth daily.    . fexofenadine (ALLEGRA) 180 MG tablet TAKE 1 TABLET (180 MG TOTAL) BY MOUTH DAILY.  2  . fluticasone (FLONASE) 50 MCG/ACT nasal spray Place 1 spray into both nostrils daily. 16 g 2  . furosemide (LASIX) 20 MG tablet Take 20 mg by mouth.    . ibandronate (BONIVA) 150 MG tablet     . ipratropium (ATROVENT) 0.06 % nasal spray 2 SPRAYS BY NASAL ROUTE 2 (TWO) TIMES DAILY AS NEEDED FOR UP TO 30 DAYS FOR RHINITIS.  11  . KLOR-CON M10 10 MEQ tablet Take 10 mEq by mouth 2 (two) times daily.  3  . lidocaine (LIDODERM) 5 % Place 1 patch onto the skin daily. Remove & Discard patch within 12 hours or as directed by MD 30 patch 0  . Magnesium 500 MG TABS Take 1 tablet by mouth every morning.     . Multiple Vitamin (MULTIVITAMIN WITH MINERALS) TABS Take 1 tablet by mouth every morning.    Marland Kitchen MYRBETRIQ 50 MG TB24 tablet     . Olopatadine HCl 0.2 % SOLN     . oxyCODONE (OXY IR/ROXICODONE) 5 MG immediate release tablet TAKE 1 TABLET BY MOUTH 3 TIMES A DAY AS NEEDED FOR 30 DAYS  0  . pantoprazole (PROTONIX) 40 MG tablet Take 1 tablet (40 mg total) by mouth daily. 30 tablet 0  . Potassium Chloride ER 20 MEQ TBCR Take 1 tablet by mouth daily. 30 tablet 1  . primidone (MYSOLINE) 50 MG tablet PLEASE SEE ATTACHED FOR DETAILED DIRECTIONS  6  . sodium chloride (MURO 128) 2 % ophthalmic solution Place 1 drop into both eyes every morning.    . solifenacin (VESICARE) 10 MG tablet Take by mouth daily.    Marland Kitchen tiZANidine (ZANAFLEX) 4 MG tablet Take 4 mg by mouth every 6 (six) hours as needed for muscle spasms.    Marland Kitchen tolterodine (DETROL) 2 MG  tablet Take 2 mg by mouth 2 (two) times daily.    Marland Kitchen triamterene-hydrochlorothiazide (DYAZIDE) 37.5-25 MG per capsule Take 1 capsule by mouth daily.    . vitamin B-12 (CYANOCOBALAMIN) 1000 MCG tablet Take 1,000 mcg by mouth every morning.      No current facility-administered medications on file prior to visit.     Past Surgical History:  Procedure Laterality Date  . APPENDECTOMY    .  BACK SURGERY    . BREAST SURGERY    . CESAREAN SECTION    . EYE SURGERY    . FOOT SURGERY    . FOOT SURGERY    . FOOT SURGERY Left   . MOHS SURGERY    . NECK SURGERY      Allergies  Allergen Reactions  . Doxycycline   . Gabapentin   . Other     Eye drop  . Polymyxin B     Eye pain and redness  . Tape Hives    Pt can tolerate paper tape Redness, sore skin    Social History   Socioeconomic History  . Marital status: Divorced    Spouse name: Not on file  . Number of children: Not on file  . Years of education: Not on file  . Highest education level: Not on file  Occupational History  . Not on file  Social Needs  . Financial resource strain: Not on file  . Food insecurity:    Worry: Not on file    Inability: Not on file  . Transportation needs:    Medical: Not on file    Non-medical: Not on file  Tobacco Use  . Smoking status: Current Every Day Smoker    Types: Cigarettes  . Smokeless tobacco: Never Used  Substance and Sexual Activity  . Alcohol use: No  . Drug use: No  . Sexual activity: Not on file  Lifestyle  . Physical activity:    Days per week: Not on file    Minutes per session: Not on file  . Stress: Not on file  Relationships  . Social connections:    Talks on phone: Not on file    Gets together: Not on file    Attends religious service: Not on file    Active member of club or organization: Not on file    Attends meetings of clubs or organizations: Not on file    Relationship status: Not on file  . Intimate partner violence:    Fear of current or ex partner:  Not on file    Emotionally abused: Not on file    Physically abused: Not on file    Forced sexual activity: Not on file  Other Topics Concern  . Not on file  Social History Narrative  . Not on file    Family History  Problem Relation Age of Onset  . Kidney failure Mother     BP 109/70   Pulse 80   Ht 5\' 2"  (1.575 m)   Wt 170 lb (77.1 kg)   BMI 31.09 kg/m   Review of Systems: See HPI above.     Objective:  Physical Exam:  Gen: NAD, comfortable in exam room  Left knee/leg: No gross deformity, ecchymoses.  Minimal effusion, bakers cyst. TTP medial joint line, mild throughout lower leg. FROM with 5/5 strength. Negative ant/post drawers. Negative valgus/varus testing. Negative lachmanns. Negative mcmurrays, apleys, patellar apprehension. NV intact distally.  Right foot/ankle: No gross deformity, ecchymoses.  Mild swelling throughout. FROM with 5/5 strength all directions, pain ER. TTP peroneal tendons. Negative ant drawer and talar tilt.   Negative syndesmotic compression. Thompsons test negative. NV intact distally.  Assessment & Plan:  1.  Left leg pain - known ruptured bakers cyst and chronic venous insufficiency.  Continued pain in left knee radiating down leg.  Not responded to two cortisone injections to date.  2 doppler u/s negative for DVT>  Will go ahead with MRI  of left knee.  Icing, elevation, aleve if needed.  2. Right foot pain - 2/2 peroneal tendinitis.  Icing, aleve, arch supports.  Shown theraband strengthening exercises to do daily.

## 2018-02-19 DIAGNOSIS — G629 Polyneuropathy, unspecified: Secondary | ICD-10-CM | POA: Diagnosis not present

## 2018-02-19 DIAGNOSIS — M48061 Spinal stenosis, lumbar region without neurogenic claudication: Secondary | ICD-10-CM | POA: Diagnosis not present

## 2018-02-19 DIAGNOSIS — G63 Polyneuropathy in diseases classified elsewhere: Secondary | ICD-10-CM | POA: Diagnosis not present

## 2018-02-23 ENCOUNTER — Ambulatory Visit (HOSPITAL_BASED_OUTPATIENT_CLINIC_OR_DEPARTMENT_OTHER)
Admission: RE | Admit: 2018-02-23 | Discharge: 2018-02-23 | Disposition: A | Discharge status: Home / Self Care | Payer: Medicare Other | Source: Ambulatory Visit | Attending: Family Medicine | Admitting: Family Medicine

## 2018-02-23 DIAGNOSIS — M1712 Unilateral primary osteoarthritis, left knee: Secondary | ICD-10-CM | POA: Diagnosis not present

## 2018-02-23 DIAGNOSIS — S83232A Complex tear of medial meniscus, current injury, left knee, initial encounter: Secondary | ICD-10-CM | POA: Insufficient documentation

## 2018-02-23 DIAGNOSIS — X58XXXA Exposure to other specified factors, initial encounter: Secondary | ICD-10-CM | POA: Diagnosis not present

## 2018-02-23 DIAGNOSIS — M7122 Synovial cyst of popliteal space [Baker], left knee: Secondary | ICD-10-CM | POA: Insufficient documentation

## 2018-02-23 DIAGNOSIS — M25562 Pain in left knee: Secondary | ICD-10-CM | POA: Diagnosis not present

## 2018-02-23 DIAGNOSIS — R937 Abnormal findings on diagnostic imaging of other parts of musculoskeletal system: Secondary | ICD-10-CM | POA: Diagnosis not present

## 2018-02-27 DIAGNOSIS — E6609 Other obesity due to excess calories: Secondary | ICD-10-CM | POA: Diagnosis not present

## 2018-02-27 DIAGNOSIS — G4733 Obstructive sleep apnea (adult) (pediatric): Secondary | ICD-10-CM | POA: Diagnosis not present

## 2018-02-27 DIAGNOSIS — Z6832 Body mass index (BMI) 32.0-32.9, adult: Secondary | ICD-10-CM | POA: Diagnosis not present

## 2018-02-27 DIAGNOSIS — Z9989 Dependence on other enabling machines and devices: Secondary | ICD-10-CM | POA: Diagnosis not present

## 2018-03-01 ENCOUNTER — Telehealth: Payer: Self-pay | Admitting: Family Medicine

## 2018-03-01 NOTE — Telephone Encounter (Signed)
Patient calling with questions regarding her arthritis. States she was using ice for her baker's cyst and wants to know if she should continue with ice for her arthritis or switch to a heating pad

## 2018-03-01 NOTE — Telephone Encounter (Signed)
Patient was informed.

## 2018-03-01 NOTE — Telephone Encounter (Signed)
She can use whichever feels better for this now.

## 2018-03-20 DIAGNOSIS — M5442 Lumbago with sciatica, left side: Secondary | ICD-10-CM | POA: Diagnosis not present

## 2018-03-20 DIAGNOSIS — M5441 Lumbago with sciatica, right side: Secondary | ICD-10-CM | POA: Diagnosis not present

## 2018-03-20 DIAGNOSIS — M542 Cervicalgia: Secondary | ICD-10-CM | POA: Diagnosis not present

## 2018-03-20 DIAGNOSIS — G250 Essential tremor: Secondary | ICD-10-CM | POA: Diagnosis not present

## 2018-03-20 DIAGNOSIS — R270 Ataxia, unspecified: Secondary | ICD-10-CM | POA: Diagnosis not present

## 2018-03-20 DIAGNOSIS — G603 Idiopathic progressive neuropathy: Secondary | ICD-10-CM | POA: Diagnosis not present

## 2018-03-20 DIAGNOSIS — G5603 Carpal tunnel syndrome, bilateral upper limbs: Secondary | ICD-10-CM | POA: Diagnosis not present

## 2018-04-23 ENCOUNTER — Encounter: Payer: Self-pay | Admitting: Family Medicine

## 2018-04-23 ENCOUNTER — Ambulatory Visit (INDEPENDENT_AMBULATORY_CARE_PROVIDER_SITE_OTHER): Payer: Medicare Other | Admitting: Family Medicine

## 2018-04-23 DIAGNOSIS — M1712 Unilateral primary osteoarthritis, left knee: Secondary | ICD-10-CM

## 2018-04-23 NOTE — Patient Instructions (Signed)
Follow up with me in 1 week for the second injection. 

## 2018-04-24 NOTE — Progress Notes (Signed)
PCP: Default, Provider, MD  Subjective:   HPI: Patient is a 72 y.o. female here for left knee pain.  3/19: Patient reports about 10 days ago she started to get a sharp pain within her left knee both medially and posteriorly. She tried Tylenol without much benefit. She has had to use a cane the pain is worse with walking. She reports having a history of peripheral neuropathy and has had shots in her neck and back. She had a Doppler ultrasound of her left leg which showed no evidence of a DVT but did show evidence of Baker's cyst. Denies prior problems with her left knee. Pain level is a 10 out of 10 currently and sharp. No skin changes or numbness.  3/25: Patient returns reporting increased pain and swelling in her left lower leg. She denies any fevers, chills, or sweats. She does feel that the knee does feel warmer however. She has been using heat to the area and taking Aleve. She states she does not like to elevate it because it hurts worse. Pain level is 10 out of 10 at worst and sharp. Minimal redness anterior lower leg, no numbness.  4/4: Patient reports that she feels about the same or worse compared to last visit. States she is having spasms in her calf and 10 out of 10 level of pain in her lower leg. She is taking oxycodone, Cymbalta. Still with swelling. She does take triamterene and Lasix. She is walking with a cane. She still not elevating because she states that this increases the pain. She is not using anything for compression. She tolerated the Keflex but reports she believes she has a yeast infection and would like medication for this. No other skin changes, numbness.  5/30: Patient reports her pain is 5/10 but states she is 'not doing well' Pain still in left knee and leg anteriorly with swelling though swelling has improved. Pain worse with walking and by end of day. Just started therapy but only had one visit. No new injuries, skin  changes.  6/25: Patient reports the injection repeated last visit helped her but she also is reporting 10/10 level pain around her knee and lower leg anteriorly into her calf and hamstring. She is still not elevating, using anything for compression as she states these make her pain worse. Is taking lasix. Taking aleve, doing physical therapy with TENS unit - noting help with this. Using cane. Feels like leg will give out at times. No skin changes, new injuries.  8/15: Patient returns with continued pain in left knee radiating down her leg. Pain level 5/10, sharp. Worse with prolonged sitting then going to get up. Has done physical therapy, home exercises.   Rarely taken aleve, iced the area. Injections have only helped a little. No skin changes. Getting pain lateral right ankle down into foot.  10/22: Patient reports she's having 10/10 anterior left knee pain. Radiates to posterior knee. Associated swelling of the joint. No skin changes.  Past Medical History:  Diagnosis Date  . Depression   . Deviated septum   . Diverticulitis 2014  . Hearing loss   . Left knee pain 09/20/2017  . Sinus drainage   . Skin cancer   . Sleep apnea     Current Outpatient Medications on File Prior to Visit  Medication Sig Dispense Refill  . albuterol (PROVENTIL HFA;VENTOLIN HFA) 108 (90 BASE) MCG/ACT inhaler Inhale 2 puffs into the lungs every 6 (six) hours as needed for wheezing.    Marland Kitchen  alendronate (FOSAMAX) 70 MG tablet Take 70 mg by mouth every 30 (thirty) days. Take with a full glass of water on an empty stomach on Sundays     . ALPRAZolam (XANAX) 0.5 MG tablet Take 0.5 mg by mouth 2 (two) times daily.    . Armodafinil (NUVIGIL) 250 MG tablet Take 250 mg by mouth daily.    Marland Kitchen aspirin 81 MG tablet Take 81 mg by mouth every morning. Take an additional tablet if needed for leg cramps in the evening    . atorvastatin (LIPITOR) 20 MG tablet Take 20 mg by mouth at bedtime.  3  . Bilberry 1000 MG  CAPS Take 1 tablet by mouth every morning.     . budesonide-formoterol (SYMBICORT) 160-4.5 MCG/ACT inhaler Inhale 2 puffs into the lungs daily as needed (for wheezing).    Marland Kitchen buPROPion (WELLBUTRIN SR) 150 MG 12 hr tablet Take 150 mg by mouth 2 (two) times daily.    . cholecalciferol (VITAMIN D) 1000 UNITS tablet Take 1,000 Units by mouth every morning.    . docusate sodium (COLACE) 100 MG capsule Take 1 capsule (100 mg total) by mouth every 12 (twelve) hours. 60 capsule 0  . DULoxetine (CYMBALTA) 60 MG capsule Take 60 mg by mouth daily.    . fexofenadine (ALLEGRA) 180 MG tablet TAKE 1 TABLET (180 MG TOTAL) BY MOUTH DAILY.  2  . fluticasone (FLONASE) 50 MCG/ACT nasal spray Place 1 spray into both nostrils daily. 16 g 2  . furosemide (LASIX) 20 MG tablet Take 20 mg by mouth.    . ibandronate (BONIVA) 150 MG tablet     . ipratropium (ATROVENT) 0.06 % nasal spray 2 SPRAYS BY NASAL ROUTE 2 (TWO) TIMES DAILY AS NEEDED FOR UP TO 30 DAYS FOR RHINITIS.  11  . KLOR-CON M10 10 MEQ tablet Take 10 mEq by mouth 2 (two) times daily.  3  . lidocaine (LIDODERM) 5 % Place 1 patch onto the skin daily. Remove & Discard patch within 12 hours or as directed by MD 30 patch 0  . Magnesium 500 MG TABS Take 1 tablet by mouth every morning.     . Multiple Vitamin (MULTIVITAMIN WITH MINERALS) TABS Take 1 tablet by mouth every morning.    Marland Kitchen MYRBETRIQ 50 MG TB24 tablet     . Olopatadine HCl 0.2 % SOLN     . oxyCODONE (OXY IR/ROXICODONE) 5 MG immediate release tablet TAKE 1 TABLET BY MOUTH 3 TIMES A DAY AS NEEDED FOR 30 DAYS  0  . pantoprazole (PROTONIX) 40 MG tablet Take 1 tablet (40 mg total) by mouth daily. 30 tablet 0  . Potassium Chloride ER 20 MEQ TBCR Take 1 tablet by mouth daily. 30 tablet 1  . primidone (MYSOLINE) 50 MG tablet PLEASE SEE ATTACHED FOR DETAILED DIRECTIONS  6  . sodium chloride (MURO 128) 2 % ophthalmic solution Place 1 drop into both eyes every morning.    . solifenacin (VESICARE) 10 MG tablet Take by  mouth daily.    Marland Kitchen tiZANidine (ZANAFLEX) 4 MG tablet Take 4 mg by mouth every 6 (six) hours as needed for muscle spasms.    Marland Kitchen tolterodine (DETROL) 2 MG tablet Take 2 mg by mouth 2 (two) times daily.    Marland Kitchen triamterene-hydrochlorothiazide (DYAZIDE) 37.5-25 MG per capsule Take 1 capsule by mouth daily.    . vitamin B-12 (CYANOCOBALAMIN) 1000 MCG tablet Take 1,000 mcg by mouth every morning.      No current facility-administered medications on file  prior to visit.     Past Surgical History:  Procedure Laterality Date  . APPENDECTOMY    . BACK SURGERY    . BREAST SURGERY    . CESAREAN SECTION    . EYE SURGERY    . FOOT SURGERY    . FOOT SURGERY    . FOOT SURGERY Left   . MOHS SURGERY    . NECK SURGERY      Allergies  Allergen Reactions  . Doxycycline   . Gabapentin   . Other     Eye drop  . Polymyxin B     Eye pain and redness  . Tape Hives    Pt can tolerate paper tape Redness, sore skin    Social History   Socioeconomic History  . Marital status: Divorced    Spouse name: Not on file  . Number of children: Not on file  . Years of education: Not on file  . Highest education level: Not on file  Occupational History  . Not on file  Social Needs  . Financial resource strain: Not on file  . Food insecurity:    Worry: Not on file    Inability: Not on file  . Transportation needs:    Medical: Not on file    Non-medical: Not on file  Tobacco Use  . Smoking status: Current Every Day Smoker    Types: Cigarettes  . Smokeless tobacco: Never Used  Substance and Sexual Activity  . Alcohol use: No  . Drug use: No  . Sexual activity: Not on file  Lifestyle  . Physical activity:    Days per week: Not on file    Minutes per session: Not on file  . Stress: Not on file  Relationships  . Social connections:    Talks on phone: Not on file    Gets together: Not on file    Attends religious service: Not on file    Active member of club or organization: Not on file     Attends meetings of clubs or organizations: Not on file    Relationship status: Not on file  . Intimate partner violence:    Fear of current or ex partner: Not on file    Emotionally abused: Not on file    Physically abused: Not on file    Forced sexual activity: Not on file  Other Topics Concern  . Not on file  Social History Narrative  . Not on file    Family History  Problem Relation Age of Onset  . Kidney failure Mother     BP 121/75   Pulse 84   Ht 5\' 2"  (1.575 m)   Wt 175 lb (79.4 kg)   BMI 32.01 kg/m   Review of Systems: See HPI above.     Objective:  Physical Exam:  Gen: NAD, comfortable in exam room  Left knee: Mild effusion.  No gross deformity, ecchymoses. TTP medial joint line > lateral joint line and post patellar facets. ROM 0 - 90 degrees. Negative ant/post drawers. Negative valgus/varus testing. Negative lachmans. Negative mcmurrays, apleys. NV intact distally.  Assessment & Plan:  1.  Left knee pain - 2/2 arthritis.  Prior pain also related to ruptured bakers cyst and venous insufficiency.  S/p 2 steroid injections without much benefit.  Starting orthovisc series today.  Icing, elevation, tylenol if needed.  F/u in 1 week for second injection.  After informed written consent timeout was performed, patient was lying supine on exam table.  Left knee was prepped with alcohol swab and utilizing superolateral approach, patient's left knee was injected intraarticularly with 42mL bupivicaine followed by orthovisc. Patient tolerated the procedure well without immediate complications.

## 2018-04-30 ENCOUNTER — Encounter: Payer: Self-pay | Admitting: Family Medicine

## 2018-04-30 ENCOUNTER — Ambulatory Visit (INDEPENDENT_AMBULATORY_CARE_PROVIDER_SITE_OTHER): Payer: Medicare Other | Admitting: Family Medicine

## 2018-04-30 DIAGNOSIS — M1712 Unilateral primary osteoarthritis, left knee: Secondary | ICD-10-CM | POA: Diagnosis not present

## 2018-05-01 ENCOUNTER — Encounter: Payer: Self-pay | Admitting: Family Medicine

## 2018-05-01 NOTE — Progress Notes (Signed)
PCP: Default, Provider, MD  Subjective:   HPI: Patient is a 72 y.o. female here for left knee pain.  3/19: Patient reports about 10 days ago she started to get a sharp pain within her left knee both medially and posteriorly. She tried Tylenol without much benefit. She has had to use a cane the pain is worse with walking. She reports having a history of peripheral neuropathy and has had shots in her neck and back. She had a Doppler ultrasound of her left leg which showed no evidence of a DVT but did show evidence of Baker's cyst. Denies prior problems with her left knee. Pain level is a 10 out of 10 currently and sharp. No skin changes or numbness.  3/25: Patient returns reporting increased pain and swelling in her left lower leg. She denies any fevers, chills, or sweats. She does feel that the knee does feel warmer however. She has been using heat to the area and taking Aleve. She states she does not like to elevate it because it hurts worse. Pain level is 10 out of 10 at worst and sharp. Minimal redness anterior lower leg, no numbness.  4/4: Patient reports that she feels about the same or worse compared to last visit. States she is having spasms in her calf and 10 out of 10 level of pain in her lower leg. She is taking oxycodone, Cymbalta. Still with swelling. She does take triamterene and Lasix. She is walking with a cane. She still not elevating because she states that this increases the pain. She is not using anything for compression. She tolerated the Keflex but reports she believes she has a yeast infection and would like medication for this. No other skin changes, numbness.  5/30: Patient reports her pain is 5/10 but states she is 'not doing well' Pain still in left knee and leg anteriorly with swelling though swelling has improved. Pain worse with walking and by end of day. Just started therapy but only had one visit. No new injuries, skin  changes.  6/25: Patient reports the injection repeated last visit helped her but she also is reporting 10/10 level pain around her knee and lower leg anteriorly into her calf and hamstring. She is still not elevating, using anything for compression as she states these make her pain worse. Is taking lasix. Taking aleve, doing physical therapy with TENS unit - noting help with this. Using cane. Feels like leg will give out at times. No skin changes, new injuries.  8/15: Patient returns with continued pain in left knee radiating down her leg. Pain level 5/10, sharp. Worse with prolonged sitting then going to get up. Has done physical therapy, home exercises.   Rarely taken aleve, iced the area. Injections have only helped a little. No skin changes. Getting pain lateral right ankle down into foot.  10/22: Patient reports she's having 10/10 anterior left knee pain. Radiates to posterior knee. Associated swelling of the joint. No skin changes.  10/29: Patient returns today with 10/10 pain left knee. Worse with ambulation; using a cane. No skin changes.  Past Medical History:  Diagnosis Date  . Depression   . Deviated septum   . Diverticulitis 2014  . Hearing loss   . Left knee pain 09/20/2017  . Sinus drainage   . Skin cancer   . Sleep apnea     Current Outpatient Medications on File Prior to Visit  Medication Sig Dispense Refill  . albuterol (PROVENTIL HFA;VENTOLIN HFA) 108 (90 BASE) MCG/ACT  inhaler Inhale 2 puffs into the lungs every 6 (six) hours as needed for wheezing.    Marland Kitchen alendronate (FOSAMAX) 70 MG tablet Take 70 mg by mouth every 30 (thirty) days. Take with a full glass of water on an empty stomach on Sundays     . ALPRAZolam (XANAX) 0.5 MG tablet Take 0.5 mg by mouth 2 (two) times daily.    . Armodafinil (NUVIGIL) 250 MG tablet Take 250 mg by mouth daily.    Marland Kitchen aspirin 81 MG tablet Take 81 mg by mouth every morning. Take an additional tablet if needed for leg cramps  in the evening    . atorvastatin (LIPITOR) 20 MG tablet Take 20 mg by mouth at bedtime.  3  . Bilberry 1000 MG CAPS Take 1 tablet by mouth every morning.     . budesonide-formoterol (SYMBICORT) 160-4.5 MCG/ACT inhaler Inhale 2 puffs into the lungs daily as needed (for wheezing).    Marland Kitchen buPROPion (WELLBUTRIN SR) 150 MG 12 hr tablet Take 150 mg by mouth 2 (two) times daily.    . cholecalciferol (VITAMIN D) 1000 UNITS tablet Take 1,000 Units by mouth every morning.    . docusate sodium (COLACE) 100 MG capsule Take 1 capsule (100 mg total) by mouth every 12 (twelve) hours. 60 capsule 0  . DULoxetine (CYMBALTA) 60 MG capsule Take 60 mg by mouth daily.    . fexofenadine (ALLEGRA) 180 MG tablet TAKE 1 TABLET (180 MG TOTAL) BY MOUTH DAILY.  2  . fluticasone (FLONASE) 50 MCG/ACT nasal spray Place 1 spray into both nostrils daily. 16 g 2  . furosemide (LASIX) 20 MG tablet Take 20 mg by mouth.    . ibandronate (BONIVA) 150 MG tablet     . ipratropium (ATROVENT) 0.06 % nasal spray 2 SPRAYS BY NASAL ROUTE 2 (TWO) TIMES DAILY AS NEEDED FOR UP TO 30 DAYS FOR RHINITIS.  11  . KLOR-CON M10 10 MEQ tablet Take 10 mEq by mouth 2 (two) times daily.  3  . lidocaine (LIDODERM) 5 % Place 1 patch onto the skin daily. Remove & Discard patch within 12 hours or as directed by MD 30 patch 0  . Magnesium 500 MG TABS Take 1 tablet by mouth every morning.     . Multiple Vitamin (MULTIVITAMIN WITH MINERALS) TABS Take 1 tablet by mouth every morning.    Marland Kitchen MYRBETRIQ 50 MG TB24 tablet     . Olopatadine HCl 0.2 % SOLN     . oxyCODONE (OXY IR/ROXICODONE) 5 MG immediate release tablet TAKE 1 TABLET BY MOUTH 3 TIMES A DAY AS NEEDED FOR 30 DAYS  0  . pantoprazole (PROTONIX) 40 MG tablet Take 1 tablet (40 mg total) by mouth daily. 30 tablet 0  . Potassium Chloride ER 20 MEQ TBCR Take 1 tablet by mouth daily. 30 tablet 1  . primidone (MYSOLINE) 50 MG tablet PLEASE SEE ATTACHED FOR DETAILED DIRECTIONS  6  . sodium chloride (MURO 128) 2 %  ophthalmic solution Place 1 drop into both eyes every morning.    . solifenacin (VESICARE) 10 MG tablet Take by mouth daily.    Marland Kitchen tiZANidine (ZANAFLEX) 4 MG tablet Take 4 mg by mouth every 6 (six) hours as needed for muscle spasms.    Marland Kitchen tolterodine (DETROL) 2 MG tablet Take 2 mg by mouth 2 (two) times daily.    Marland Kitchen triamterene-hydrochlorothiazide (DYAZIDE) 37.5-25 MG per capsule Take 1 capsule by mouth daily.    . vitamin B-12 (CYANOCOBALAMIN) 1000 MCG  tablet Take 1,000 mcg by mouth every morning.      No current facility-administered medications on file prior to visit.     Past Surgical History:  Procedure Laterality Date  . APPENDECTOMY    . BACK SURGERY    . BREAST SURGERY    . CESAREAN SECTION    . EYE SURGERY    . FOOT SURGERY    . FOOT SURGERY    . FOOT SURGERY Left   . MOHS SURGERY    . NECK SURGERY      Allergies  Allergen Reactions  . Doxycycline   . Gabapentin   . Other     Eye drop  . Polymyxin B     Eye pain and redness  . Tape Hives    Pt can tolerate paper tape Redness, sore skin    Social History   Socioeconomic History  . Marital status: Divorced    Spouse name: Not on file  . Number of children: Not on file  . Years of education: Not on file  . Highest education level: Not on file  Occupational History  . Not on file  Social Needs  . Financial resource strain: Not on file  . Food insecurity:    Worry: Not on file    Inability: Not on file  . Transportation needs:    Medical: Not on file    Non-medical: Not on file  Tobacco Use  . Smoking status: Current Every Day Smoker    Types: Cigarettes  . Smokeless tobacco: Never Used  Substance and Sexual Activity  . Alcohol use: No  . Drug use: No  . Sexual activity: Not on file  Lifestyle  . Physical activity:    Days per week: Not on file    Minutes per session: Not on file  . Stress: Not on file  Relationships  . Social connections:    Talks on phone: Not on file    Gets together: Not on  file    Attends religious service: Not on file    Active member of club or organization: Not on file    Attends meetings of clubs or organizations: Not on file    Relationship status: Not on file  . Intimate partner violence:    Fear of current or ex partner: Not on file    Emotionally abused: Not on file    Physically abused: Not on file    Forced sexual activity: Not on file  Other Topics Concern  . Not on file  Social History Narrative  . Not on file    Family History  Problem Relation Age of Onset  . Kidney failure Mother     BP 125/72   Pulse 82   Ht 5\' 2"  (1.575 m)   Wt 175 lb (79.4 kg)   BMI 32.01 kg/m   Review of Systems: See HPI above.     Objective:  Physical Exam:  Gen: NAD, comfortable in exam room  Rest of exam not repeated today. Left knee: Mild effusion.  No gross deformity, ecchymoses. TTP medial joint line > lateral joint line and post patellar facets. ROM 0 - 90 degrees. Negative ant/post drawers. Negative valgus/varus testing. Negative lachmans. Negative mcmurrays, apleys. NV intact distally.  Assessment & Plan:  1.  Left knee pain - 2/2 arthritis.  Previously ruptured bakers cyst and venous insufficiency.  S/p 2 steroid injections without much benefit.  Second orthovisc injection given today.  Icing, elevation, tylenol if needed.  F/u in 1 week for third injection.    After informed written consent timeout was performed, patient was lying supine on exam table. Left knee was prepped with alcohol swab and utilizing superolateral approach, patient's left knee was injected intraarticularly with 74mL bupivicaine followed by orthovisc. Patient tolerated the procedure well without immediate complications.

## 2018-05-07 ENCOUNTER — Encounter: Payer: Self-pay | Admitting: Family Medicine

## 2018-05-07 ENCOUNTER — Ambulatory Visit (INDEPENDENT_AMBULATORY_CARE_PROVIDER_SITE_OTHER): Payer: Medicare Other | Admitting: Family Medicine

## 2018-05-07 DIAGNOSIS — M1712 Unilateral primary osteoarthritis, left knee: Secondary | ICD-10-CM

## 2018-05-07 NOTE — Patient Instructions (Signed)
Call me in 2 weeks to let me know how you're doing. I would not do the 4th shot in a week - 3 shots should be enough to give you benefit but it may take 2 more weeks before you feel a difference. If you're struggling I'm going to recommend you talk to a surgeon though.

## 2018-05-08 ENCOUNTER — Encounter: Payer: Self-pay | Admitting: Family Medicine

## 2018-05-08 DIAGNOSIS — Z23 Encounter for immunization: Secondary | ICD-10-CM | POA: Diagnosis not present

## 2018-05-08 NOTE — Progress Notes (Signed)
PCP: Default, Provider, MD  Subjective:   HPI: Patient is a 72 y.o. female here for left knee pain.  3/19: Patient reports about 10 days ago she started to get a sharp pain within her left knee both medially and posteriorly. She tried Tylenol without much benefit. She has had to use a cane the pain is worse with walking. She reports having a history of peripheral neuropathy and has had shots in her neck and back. She had a Doppler ultrasound of her left leg which showed no evidence of a DVT but did show evidence of Baker's cyst. Denies prior problems with her left knee. Pain level is a 10 out of 10 currently and sharp. No skin changes or numbness.  3/25: Patient returns reporting increased pain and swelling in her left lower leg. She denies any fevers, chills, or sweats. She does feel that the knee does feel warmer however. She has been using heat to the area and taking Aleve. She states she does not like to elevate it because it hurts worse. Pain level is 10 out of 10 at worst and sharp. Minimal redness anterior lower leg, no numbness.  4/4: Patient reports that she feels about the same or worse compared to last visit. States she is having spasms in her calf and 10 out of 10 level of pain in her lower leg. She is taking oxycodone, Cymbalta. Still with swelling. She does take triamterene and Lasix. She is walking with a cane. She still not elevating because she states that this increases the pain. She is not using anything for compression. She tolerated the Keflex but reports she believes she has a yeast infection and would like medication for this. No other skin changes, numbness.  5/30: Patient reports her pain is 5/10 but states she is 'not doing well' Pain still in left knee and leg anteriorly with swelling though swelling has improved. Pain worse with walking and by end of day. Just started therapy but only had one visit. No new injuries, skin  changes.  6/25: Patient reports the injection repeated last visit helped her but she also is reporting 10/10 level pain around her knee and lower leg anteriorly into her calf and hamstring. She is still not elevating, using anything for compression as she states these make her pain worse. Is taking lasix. Taking aleve, doing physical therapy with TENS unit - noting help with this. Using cane. Feels like leg will give out at times. No skin changes, new injuries.  8/15: Patient returns with continued pain in left knee radiating down her leg. Pain level 5/10, sharp. Worse with prolonged sitting then going to get up. Has done physical therapy, home exercises.   Rarely taken aleve, iced the area. Injections have only helped a little. No skin changes. Getting pain lateral right ankle down into foot.  10/22: Patient reports she's having 10/10 anterior left knee pain. Radiates to posterior knee. Associated swelling of the joint. No skin changes.  10/29: Patient returns today with 10/10 pain left knee. Worse with ambulation; using a cane. No skin changes.  11/5: Patient returns for third orthovisc injection. Pain level still at 10/10 medial left knee. No skin changes.  Past Medical History:  Diagnosis Date  . Depression   . Deviated septum   . Diverticulitis 2014  . Hearing loss   . Left knee pain 09/20/2017  . Sinus drainage   . Skin cancer   . Sleep apnea     Current Outpatient Medications on  File Prior to Visit  Medication Sig Dispense Refill  . albuterol (PROVENTIL HFA;VENTOLIN HFA) 108 (90 BASE) MCG/ACT inhaler Inhale 2 puffs into the lungs every 6 (six) hours as needed for wheezing.    Marland Kitchen alendronate (FOSAMAX) 70 MG tablet Take 70 mg by mouth every 30 (thirty) days. Take with a full glass of water on an empty stomach on Sundays     . ALPRAZolam (XANAX) 0.5 MG tablet Take 0.5 mg by mouth 2 (two) times daily.    . Armodafinil (NUVIGIL) 250 MG tablet Take 250 mg by mouth  daily.    Marland Kitchen aspirin 81 MG tablet Take 81 mg by mouth every morning. Take an additional tablet if needed for leg cramps in the evening    . atorvastatin (LIPITOR) 20 MG tablet Take 20 mg by mouth at bedtime.  3  . Bilberry 1000 MG CAPS Take 1 tablet by mouth every morning.     . budesonide-formoterol (SYMBICORT) 160-4.5 MCG/ACT inhaler Inhale 2 puffs into the lungs daily as needed (for wheezing).    Marland Kitchen buPROPion (WELLBUTRIN SR) 150 MG 12 hr tablet Take 150 mg by mouth 2 (two) times daily.    . cholecalciferol (VITAMIN D) 1000 UNITS tablet Take 1,000 Units by mouth every morning.    . docusate sodium (COLACE) 100 MG capsule Take 1 capsule (100 mg total) by mouth every 12 (twelve) hours. 60 capsule 0  . DULoxetine (CYMBALTA) 60 MG capsule Take 60 mg by mouth daily.    . fexofenadine (ALLEGRA) 180 MG tablet TAKE 1 TABLET (180 MG TOTAL) BY MOUTH DAILY.  2  . fluticasone (FLONASE) 50 MCG/ACT nasal spray Place 1 spray into both nostrils daily. 16 g 2  . furosemide (LASIX) 20 MG tablet Take 20 mg by mouth.    . ibandronate (BONIVA) 150 MG tablet     . ipratropium (ATROVENT) 0.06 % nasal spray 2 SPRAYS BY NASAL ROUTE 2 (TWO) TIMES DAILY AS NEEDED FOR UP TO 30 DAYS FOR RHINITIS.  11  . KLOR-CON M10 10 MEQ tablet Take 10 mEq by mouth 2 (two) times daily.  3  . lidocaine (LIDODERM) 5 % Place 1 patch onto the skin daily. Remove & Discard patch within 12 hours or as directed by MD 30 patch 0  . Magnesium 500 MG TABS Take 1 tablet by mouth every morning.     . Multiple Vitamin (MULTIVITAMIN WITH MINERALS) TABS Take 1 tablet by mouth every morning.    Marland Kitchen MYRBETRIQ 50 MG TB24 tablet     . Olopatadine HCl 0.2 % SOLN     . oxyCODONE (OXY IR/ROXICODONE) 5 MG immediate release tablet TAKE 1 TABLET BY MOUTH 3 TIMES A DAY AS NEEDED FOR 30 DAYS  0  . pantoprazole (PROTONIX) 40 MG tablet Take 1 tablet (40 mg total) by mouth daily. 30 tablet 0  . Potassium Chloride ER 20 MEQ TBCR Take 1 tablet by mouth daily. 30 tablet 1   . primidone (MYSOLINE) 50 MG tablet PLEASE SEE ATTACHED FOR DETAILED DIRECTIONS  6  . sodium chloride (MURO 128) 2 % ophthalmic solution Place 1 drop into both eyes every morning.    . solifenacin (VESICARE) 10 MG tablet Take by mouth daily.    Marland Kitchen tiZANidine (ZANAFLEX) 4 MG tablet Take 4 mg by mouth every 6 (six) hours as needed for muscle spasms.    Marland Kitchen tolterodine (DETROL) 2 MG tablet Take 2 mg by mouth 2 (two) times daily.    Marland Kitchen triamterene-hydrochlorothiazide (DYAZIDE)  37.5-25 MG per capsule Take 1 capsule by mouth daily.    . vitamin B-12 (CYANOCOBALAMIN) 1000 MCG tablet Take 1,000 mcg by mouth every morning.      No current facility-administered medications on file prior to visit.     Past Surgical History:  Procedure Laterality Date  . APPENDECTOMY    . BACK SURGERY    . BREAST SURGERY    . CESAREAN SECTION    . EYE SURGERY    . FOOT SURGERY    . FOOT SURGERY    . FOOT SURGERY Left   . MOHS SURGERY    . NECK SURGERY      Allergies  Allergen Reactions  . Doxycycline   . Gabapentin   . Other     Eye drop  . Polymyxin B     Eye pain and redness  . Tape Hives    Pt can tolerate paper tape Redness, sore skin    Social History   Socioeconomic History  . Marital status: Divorced    Spouse name: Not on file  . Number of children: Not on file  . Years of education: Not on file  . Highest education level: Not on file  Occupational History  . Not on file  Social Needs  . Financial resource strain: Not on file  . Food insecurity:    Worry: Not on file    Inability: Not on file  . Transportation needs:    Medical: Not on file    Non-medical: Not on file  Tobacco Use  . Smoking status: Current Every Day Smoker    Types: Cigarettes  . Smokeless tobacco: Never Used  Substance and Sexual Activity  . Alcohol use: No  . Drug use: No  . Sexual activity: Not on file  Lifestyle  . Physical activity:    Days per week: Not on file    Minutes per session: Not on file   . Stress: Not on file  Relationships  . Social connections:    Talks on phone: Not on file    Gets together: Not on file    Attends religious service: Not on file    Active member of club or organization: Not on file    Attends meetings of clubs or organizations: Not on file    Relationship status: Not on file  . Intimate partner violence:    Fear of current or ex partner: Not on file    Emotionally abused: Not on file    Physically abused: Not on file    Forced sexual activity: Not on file  Other Topics Concern  . Not on file  Social History Narrative  . Not on file    Family History  Problem Relation Age of Onset  . Kidney failure Mother     BP 123/83   Pulse 76   Ht 5\' 2"  (1.575 m)   Wt 175 lb (79.4 kg)   BMI 32.01 kg/m   Review of Systems: See HPI above.     Objective:  Physical Exam:  Gen: NAD, comfortable in exam room  Rest of exam not repeated today. Left knee: Mild effusion.  No gross deformity, ecchymoses. TTP medial joint line > lateral joint line and post patellar facets. ROM 0 - 90 degrees. Negative ant/post drawers. Negative valgus/varus testing. Negative lachmans. Negative mcmurrays, apleys. NV intact distally.  Assessment & Plan:  1.  Left knee pain - 2/2 arthritis.  Previously ruptured bakers cyst and venous insufficiency.  S/p  2 steroid injections without much benefit.  Third orthovisc injection given today.  Icing, elevation, tylenol if needed.  Let us know how she's doing in 2 weeks - if not improving would consider ortho referral.  After informed written consent timeout was performed, patient was lying supine on exam table. Left knee was prepped with alcohol swab and utilizing superolateral approach with ultrasound guidance, patient's left knee was injected intraarticularly with 52mL bupivicaine followed by orthovisc. Patient tolerated the procedure well without immediate complications.

## 2018-06-04 ENCOUNTER — Ambulatory Visit: Payer: Medicare Other | Admitting: Family Medicine

## 2018-06-04 DIAGNOSIS — L918 Other hypertrophic disorders of the skin: Secondary | ICD-10-CM | POA: Diagnosis not present

## 2018-06-04 DIAGNOSIS — L570 Actinic keratosis: Secondary | ICD-10-CM | POA: Diagnosis not present

## 2018-06-04 DIAGNOSIS — L820 Inflamed seborrheic keratosis: Secondary | ICD-10-CM | POA: Diagnosis not present

## 2018-06-04 DIAGNOSIS — D485 Neoplasm of uncertain behavior of skin: Secondary | ICD-10-CM | POA: Diagnosis not present

## 2018-06-06 ENCOUNTER — Ambulatory Visit: Payer: Medicare Other | Admitting: Family Medicine

## 2018-06-07 ENCOUNTER — Encounter: Payer: Self-pay | Admitting: Family Medicine

## 2018-06-07 ENCOUNTER — Ambulatory Visit (INDEPENDENT_AMBULATORY_CARE_PROVIDER_SITE_OTHER): Payer: Medicare Other | Admitting: Family Medicine

## 2018-06-07 VITALS — BP 127/72 | HR 87 | Ht 62.0 in | Wt 170.0 lb

## 2018-06-07 DIAGNOSIS — M1712 Unilateral primary osteoarthritis, left knee: Secondary | ICD-10-CM

## 2018-06-07 NOTE — Patient Instructions (Signed)
Let us know if you want to see a surgeon and we will refer you to one - unfortunately I think this is the next step.

## 2018-06-07 NOTE — Progress Notes (Signed)
PCP: Default, Provider, MD  Subjective:   HPI: Patient is a 72 y.o. female here for left knee pain.  3/19: Patient reports about 10 days ago she started to get a sharp pain within her left knee both medially and posteriorly. She tried Tylenol without much benefit. She has had to use a cane the pain is worse with walking. She reports having a history of peripheral neuropathy and has had shots in her neck and back. She had a Doppler ultrasound of her left leg which showed no evidence of a DVT but did show evidence of Baker's cyst. Denies prior problems with her left knee. Pain level is a 10 out of 10 currently and sharp. No skin changes or numbness.  3/25: Patient returns reporting increased pain and swelling in her left lower leg. She denies any fevers, chills, or sweats. She does feel that the knee does feel warmer however. She has been using heat to the area and taking Aleve. She states she does not like to elevate it because it hurts worse. Pain level is 10 out of 10 at worst and sharp. Minimal redness anterior lower leg, no numbness.  4/4: Patient reports that she feels about the same or worse compared to last visit. States she is having spasms in her calf and 10 out of 10 level of pain in her lower leg. She is taking oxycodone, Cymbalta. Still with swelling. She does take triamterene and Lasix. She is walking with a cane. She still not elevating because she states that this increases the pain. She is not using anything for compression. She tolerated the Keflex but reports she believes she has a yeast infection and would like medication for this. No other skin changes, numbness.  5/30: Patient reports her pain is 5/10 but states she is 'not doing well' Pain still in left knee and leg anteriorly with swelling though swelling has improved. Pain worse with walking and by end of day. Just started therapy but only had one visit. No new injuries, skin  changes.  6/25: Patient reports the injection repeated last visit helped her but she also is reporting 10/10 level pain around her knee and lower leg anteriorly into her calf and hamstring. She is still not elevating, using anything for compression as she states these make her pain worse. Is taking lasix. Taking aleve, doing physical therapy with TENS unit - noting help with this. Using cane. Feels like leg will give out at times. No skin changes, new injuries.  8/15: Patient returns with continued pain in left knee radiating down her leg. Pain level 5/10, sharp. Worse with prolonged sitting then going to get up. Has done physical therapy, home exercises.   Rarely taken aleve, iced the area. Injections have only helped a little. No skin changes. Getting pain lateral right ankle down into foot.  10/22: Patient reports she's having 10/10 anterior left knee pain. Radiates to posterior knee. Associated swelling of the joint. No skin changes.  10/29: Patient returns today with 10/10 pain left knee. Worse with ambulation; using a cane. No skin changes.  11/5: Patient returns for third orthovisc injection. Pain level still at 10/10 medial left knee. No skin changes.  12/6: Patient returns for fourth orthovisc injection. Pain 9/10 medial left knee. No skin changes.  Past Medical History:  Diagnosis Date  . Depression   . Deviated septum   . Diverticulitis 2014  . Hearing loss   . Left knee pain 09/20/2017  . Sinus drainage   .  Skin cancer   . Sleep apnea     Current Outpatient Medications on File Prior to Visit  Medication Sig Dispense Refill  . albuterol (PROVENTIL HFA;VENTOLIN HFA) 108 (90 BASE) MCG/ACT inhaler Inhale 2 puffs into the lungs every 6 (six) hours as needed for wheezing.    Marland Kitchen alendronate (FOSAMAX) 70 MG tablet Take 70 mg by mouth every 30 (thirty) days. Take with a full glass of water on an empty stomach on Sundays     . ALPRAZolam (XANAX) 0.5 MG tablet  Take 0.5 mg by mouth 2 (two) times daily.    . Armodafinil (NUVIGIL) 250 MG tablet Take 250 mg by mouth daily.    Marland Kitchen aspirin 81 MG tablet Take 81 mg by mouth every morning. Take an additional tablet if needed for leg cramps in the evening    . atorvastatin (LIPITOR) 20 MG tablet Take 20 mg by mouth at bedtime.  3  . Bilberry 1000 MG CAPS Take 1 tablet by mouth every morning.     . budesonide-formoterol (SYMBICORT) 160-4.5 MCG/ACT inhaler Inhale 2 puffs into the lungs daily as needed (for wheezing).    Marland Kitchen buPROPion (WELLBUTRIN SR) 150 MG 12 hr tablet Take 150 mg by mouth 2 (two) times daily.    . cholecalciferol (VITAMIN D) 1000 UNITS tablet Take 1,000 Units by mouth every morning.    . docusate sodium (COLACE) 100 MG capsule Take 1 capsule (100 mg total) by mouth every 12 (twelve) hours. 60 capsule 0  . DULoxetine (CYMBALTA) 60 MG capsule Take 60 mg by mouth daily.    . fexofenadine (ALLEGRA) 180 MG tablet TAKE 1 TABLET (180 MG TOTAL) BY MOUTH DAILY.  2  . fluticasone (FLONASE) 50 MCG/ACT nasal spray Place 1 spray into both nostrils daily. 16 g 2  . furosemide (LASIX) 20 MG tablet Take 20 mg by mouth.    . ibandronate (BONIVA) 150 MG tablet     . ipratropium (ATROVENT) 0.06 % nasal spray 2 SPRAYS BY NASAL ROUTE 2 (TWO) TIMES DAILY AS NEEDED FOR UP TO 30 DAYS FOR RHINITIS.  11  . KLOR-CON M10 10 MEQ tablet Take 10 mEq by mouth 2 (two) times daily.  3  . lidocaine (LIDODERM) 5 % Place 1 patch onto the skin daily. Remove & Discard patch within 12 hours or as directed by MD 30 patch 0  . Magnesium 500 MG TABS Take 1 tablet by mouth every morning.     . Multiple Vitamin (MULTIVITAMIN WITH MINERALS) TABS Take 1 tablet by mouth every morning.    Marland Kitchen MYRBETRIQ 50 MG TB24 tablet     . Olopatadine HCl 0.2 % SOLN     . oxyCODONE (OXY IR/ROXICODONE) 5 MG immediate release tablet TAKE 1 TABLET BY MOUTH 3 TIMES A DAY AS NEEDED FOR 30 DAYS  0  . pantoprazole (PROTONIX) 40 MG tablet Take 1 tablet (40 mg total) by  mouth daily. 30 tablet 0  . Potassium Chloride ER 20 MEQ TBCR Take 1 tablet by mouth daily. 30 tablet 1  . primidone (MYSOLINE) 50 MG tablet PLEASE SEE ATTACHED FOR DETAILED DIRECTIONS  6  . sodium chloride (MURO 128) 2 % ophthalmic solution Place 1 drop into both eyes every morning.    . solifenacin (VESICARE) 10 MG tablet Take by mouth daily.    Marland Kitchen tiZANidine (ZANAFLEX) 4 MG tablet Take 4 mg by mouth every 6 (six) hours as needed for muscle spasms.    Marland Kitchen tolterodine (DETROL) 2 MG tablet  Take 2 mg by mouth 2 (two) times daily.    Marland Kitchen triamterene-hydrochlorothiazide (DYAZIDE) 37.5-25 MG per capsule Take 1 capsule by mouth daily.    . vitamin B-12 (CYANOCOBALAMIN) 1000 MCG tablet Take 1,000 mcg by mouth every morning.      No current facility-administered medications on file prior to visit.     Past Surgical History:  Procedure Laterality Date  . APPENDECTOMY    . BACK SURGERY    . BREAST SURGERY    . CESAREAN SECTION    . EYE SURGERY    . FOOT SURGERY    . FOOT SURGERY    . FOOT SURGERY Left   . MOHS SURGERY    . NECK SURGERY      Allergies  Allergen Reactions  . Doxycycline   . Gabapentin   . Other     Eye drop  . Polymyxin B     Eye pain and redness  . Tape Hives    Pt can tolerate paper tape Redness, sore skin    Social History   Socioeconomic History  . Marital status: Divorced    Spouse name: Not on file  . Number of children: Not on file  . Years of education: Not on file  . Highest education level: Not on file  Occupational History  . Not on file  Social Needs  . Financial resource strain: Not on file  . Food insecurity:    Worry: Not on file    Inability: Not on file  . Transportation needs:    Medical: Not on file    Non-medical: Not on file  Tobacco Use  . Smoking status: Current Every Day Smoker    Types: Cigarettes  . Smokeless tobacco: Never Used  Substance and Sexual Activity  . Alcohol use: No  . Drug use: No  . Sexual activity: Not on file   Lifestyle  . Physical activity:    Days per week: Not on file    Minutes per session: Not on file  . Stress: Not on file  Relationships  . Social connections:    Talks on phone: Not on file    Gets together: Not on file    Attends religious service: Not on file    Active member of club or organization: Not on file    Attends meetings of clubs or organizations: Not on file    Relationship status: Not on file  . Intimate partner violence:    Fear of current or ex partner: Not on file    Emotionally abused: Not on file    Physically abused: Not on file    Forced sexual activity: Not on file  Other Topics Concern  . Not on file  Social History Narrative  . Not on file    Family History  Problem Relation Age of Onset  . Kidney failure Mother     There were no vitals taken for this visit.  Review of Systems: See HPI above.     Objective:  Physical Exam:  Gen: NAD, comfortable in exam room  Rest of exam not repeated today. Left knee: Mild effusion.  No gross deformity, ecchymoses. TTP medial joint line > lateral joint line and post patellar facets. ROM 0 - 90 degrees. Negative ant/post drawers. Negative valgus/varus testing. Negative lachmans. Negative mcmurrays, apleys. NV intact distally.  Assessment & Plan:  1.  Left knee pain - 2/2 arthritis.  Previously ruptured bakers cyst and venous insufficiency.  S/p 2 steroid injections  without much benefit.  Fourth orthovisc injection given today.  Icing, elevation, tylenol if needed.  Recommended orthopedic referral to discuss replacement.  After informed written consent timeout was performed, patient was seated on exam table. Left knee was prepped with alcohol swab and utilizing anteromedial approach, patient's left knee was injected intraarticularly with 23mL bupivicaine followed by orthovisc. Patient tolerated the procedure well without immediate complications.

## 2018-06-19 DIAGNOSIS — M5441 Lumbago with sciatica, right side: Secondary | ICD-10-CM | POA: Diagnosis not present

## 2018-06-19 DIAGNOSIS — F5101 Primary insomnia: Secondary | ICD-10-CM | POA: Diagnosis not present

## 2018-06-19 DIAGNOSIS — G250 Essential tremor: Secondary | ICD-10-CM | POA: Diagnosis not present

## 2018-06-19 DIAGNOSIS — M5442 Lumbago with sciatica, left side: Secondary | ICD-10-CM | POA: Diagnosis not present

## 2018-06-19 DIAGNOSIS — M542 Cervicalgia: Secondary | ICD-10-CM | POA: Diagnosis not present

## 2018-06-19 DIAGNOSIS — G603 Idiopathic progressive neuropathy: Secondary | ICD-10-CM | POA: Diagnosis not present

## 2018-06-19 DIAGNOSIS — G5603 Carpal tunnel syndrome, bilateral upper limbs: Secondary | ICD-10-CM | POA: Diagnosis not present

## 2018-06-25 ENCOUNTER — Emergency Department (HOSPITAL_BASED_OUTPATIENT_CLINIC_OR_DEPARTMENT_OTHER): Payer: Medicare Other

## 2018-06-25 ENCOUNTER — Emergency Department (HOSPITAL_BASED_OUTPATIENT_CLINIC_OR_DEPARTMENT_OTHER)
Admission: EM | Admit: 2018-06-25 | Discharge: 2018-06-25 | Disposition: A | Discharge status: Home / Self Care | Payer: Medicare Other | Attending: Emergency Medicine | Admitting: Emergency Medicine

## 2018-06-25 ENCOUNTER — Encounter (HOSPITAL_BASED_OUTPATIENT_CLINIC_OR_DEPARTMENT_OTHER): Payer: Self-pay

## 2018-06-25 ENCOUNTER — Other Ambulatory Visit: Payer: Self-pay

## 2018-06-25 DIAGNOSIS — R05 Cough: Secondary | ICD-10-CM | POA: Diagnosis not present

## 2018-06-25 DIAGNOSIS — Z79899 Other long term (current) drug therapy: Secondary | ICD-10-CM | POA: Diagnosis not present

## 2018-06-25 DIAGNOSIS — J019 Acute sinusitis, unspecified: Secondary | ICD-10-CM | POA: Diagnosis not present

## 2018-06-25 DIAGNOSIS — F17210 Nicotine dependence, cigarettes, uncomplicated: Secondary | ICD-10-CM | POA: Diagnosis not present

## 2018-06-25 MED ORDER — AMOXICILLIN-POT CLAVULANATE 875-125 MG PO TABS: 1.0000 | ORAL_TABLET | Freq: Two times a day (BID) | ORAL | 0 refills | Status: AC

## 2018-06-25 MED ORDER — AMOXICILLIN-POT CLAVULANATE 875-125 MG PO TABS
1.0000 | ORAL_TABLET | Freq: Once | ORAL | Status: AC
  Administered 2018-06-25: 1 via ORAL
  Filled 2018-06-25: qty 1

## 2018-06-25 NOTE — ED Triage Notes (Signed)
Pt c/o flu like sx x 3 weeks-NAD-to triage in w/c-states she drove self here

## 2018-06-26 NOTE — ED Provider Notes (Signed)
[facility]ENCY DEPARTMENT Provider Note   CSN: [id number] Arrival date & time: 06/25/18  1808     History   Chief Complaint Chief Complaint  Patient presents with  . Cough    HPI [name] [name] is a 72 y.o. female.  Patient is a 72 year old female with multiple medical problems including hypertension, hearing impairment, deviated septum with chronic sinus problems, and hypertension who is presenting today with 3 weeks of persistent cough, nasal drainage, headaches.  She states initially she thought the symptoms were improving but a few days ago she started having chills and low-grade fever, facial pain, worsening cough and worsening nasal drainage.  She denies any shortness of breath.  Patient is a every day smoker but no prior history of asthma or COPD.    The history is provided by the patient.  Cough     Past Medical History:  Diagnosis Date  . Depression   . Deviated septum   . Diverticulitis 2014  . Hearing loss   . Left knee pain 09/20/2017  . Sinus drainage   . Skin cancer   . Sleep apnea     Patient Active Problem List   Diagnosis Date Noted  . Deviated septum 09/20/2017  . Left leg pain 09/20/2017  . Nuclear sclerosis, left 11/03/2015  . Pseudophakia of both eyes 08/12/2015  . Regular astigmatism of right eye 08/11/2015  . RPE mottling of macula 06/03/2015  . Insufficiency of tear film of both eyes 06/03/2015  . 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 Polypharmacy 06/04/2014  . Encounter for long-term (current) use of medications 04/23/2014  . Hyperlipidemia 03/21/2014  . Peripheral neuropathy 09/27/2013  . 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 Cervical neuropathy 09/27/2013  . Spider veins 09/03/2013  . Bilateral leg edema 09/03/2013  . Benign essential hypertension 04/18/2013  . Allergic conjunctivitis of both eyes 02/28/2013  . Insomnia 02/21/2013  . Lumbar spinal stenosis 01/31/2013  . Chronic sphenoidal sinusitis 01/31/2013  .  Senile nuclear sclerosis 01/28/2013  . Dry eye syndrome 01/28/2013  . 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 Blepharitis of both eyes 01/28/2013  . Intervertebral disc protrusion 01/20/2013  . Hearing loss 01/20/2013  . Transaminitis 11/19/2012  . Diverticulitis 11/14/2012  . 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 Obstructive sleep apnea 01/18/2012  . 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 Hip arthritis 11/19/2011  . Osteopenia 11/16/2011  . Vitamin D deficiency 11/13/2011  . Tobacco abuse 10/23/2011  . Chronic neck pain 10/23/2011  . Anxiety 10/23/2011    Past Surgical History:  Procedure Laterality Date  . APPENDECTOMY    . BACK SURGERY    . BREAST SURGERY    . CESAREAN SECTION    . EYE SURGERY    . FOOT SURGERY    . FOOT SURGERY    . FOOT SURGERY Left   . MOHS SURGERY    . NECK SURGERY       OB History   No obstetric history on file.      Home Medications    Prior to Admission medications   Medication Sig Start Date End Date Taking? Authorizing Provider  albuterol (PROVENTIL HFA;VENTOLIN HFA) 108 (90 BASE) MCG/ACT inhaler Inhale 2 puffs into the lungs every 6 (six) hours as needed for wheezing.    [provider]  alendronate (FOSAMAX) 70 MG tablet Take 70 mg by mouth every 30 (thirty) days. Take with a full glass of water on an empty stomach  on Sundays     [provider]  ALPRAZolam Duanne Moron) 0.5 MG tablet Take 0.5 mg by mouth 2 (two) times daily.    [provider]  amoxicillin-clavulanate (AUGMENTIN) 875-125 MG tablet Take 1 tablet by mouth every 12 (twelve) hours. 06/25/18   Blanchie Dessert, MD  Armodafinil (NUVIGIL) 250 MG tablet Take 250 mg by mouth daily.    [provider]  aspirin 81 MG tablet Take 81 mg by mouth every morning. Take an additional tablet if needed for leg cramps in the evening    [provider]  atorvastatin (LIPITOR) 20 MG tablet Take 20 mg by mouth  at bedtime. 01/10/18   [provider]  Bilberry 1000 MG CAPS Take 1 tablet by mouth every morning.     [provider]  budesonide-formoterol (SYMBICORT) 160-4.5 MCG/ACT inhaler Inhale 2 puffs into the lungs daily as needed (for wheezing).    [provider]  buPROPion (WELLBUTRIN SR) 150 MG 12 hr tablet Take 150 mg by mouth 2 (two) times daily.    [provider]  cholecalciferol (VITAMIN D) 1000 UNITS tablet Take 1,000 Units by mouth every morning.    [provider]  docusate sodium (COLACE) 100 MG capsule Take 1 capsule (100 mg total) by mouth every 12 (twelve) hours. 04/23/16   Long, Wonda Olds, MD  DULoxetine (CYMBALTA) 60 MG capsule Take 60 mg by mouth daily.    [provider]  fexofenadine (ALLEGRA) 180 MG tablet TAKE 1 TABLET (180 MG TOTAL) BY MOUTH DAILY. 08/29/17   [provider]  fluticasone (FLONASE) 50 MCG/ACT nasal spray Place 1 spray into both nostrils daily. 06/25/17   Khatri, Hina, PA-C  furosemide (LASIX) 20 MG tablet Take 20 mg by mouth.    [provider]  ibandronate (BONIVA) 150 MG tablet  12/19/17   [provider]  ipratropium (ATROVENT) 0.06 % nasal spray 2 SPRAYS BY NASAL ROUTE 2 (TWO) TIMES DAILY AS NEEDED FOR UP TO 30 DAYS FOR RHINITIS. 01/21/18   [provider]  KLOR-CON M10 10 MEQ tablet Take 10 mEq by mouth 2 (two) times daily. 01/21/18   [provider]  lidocaine (LIDODERM) 5 % Place 1 patch onto the skin daily. Remove & Discard patch within 12 hours or as directed by MD 04/23/16   Long, Wonda Olds, MD  Magnesium 500 MG TABS Take 1 tablet by mouth every morning.     [provider]  Multiple Vitamin (MULTIVITAMIN WITH MINERALS) TABS Take 1 tablet by mouth every morning.    [provider]  MYRBETRIQ 50 MG TB24 tablet  12/05/17   [provider]  Olopatadine HCl 0.2 % SOLN  09/13/17   [provider]  oxyCODONE (OXY IR/ROXICODONE) 5 MG  immediate release tablet TAKE 1 TABLET BY MOUTH 3 TIMES A DAY AS NEEDED FOR 30 DAYS 08/07/17   [provider]  pantoprazole (PROTONIX) 40 MG tablet Take 1 tablet (40 mg total) by mouth daily. 11/21/12   Riebock, Estill Bakes, NP  Potassium Chloride ER 20 MEQ TBCR Take 1 tablet by mouth daily. 10/11/17   Hudnall, Sharyn Lull, MD  primidone (MYSOLINE) 50 MG tablet PLEASE SEE ATTACHED FOR DETAILED DIRECTIONS 08/07/17   [provider]  sodium chloride (MURO 128) 2 % ophthalmic solution Place 1 drop into both eyes every morning.    [provider]  solifenacin (VESICARE) 10 MG tablet Take by mouth daily.    [provider]  tiZANidine (ZANAFLEX) 4  MG tablet Take 4 mg by mouth every 6 (six) hours as needed for muscle spasms.    [provider]  tolterodine (DETROL) 2 MG tablet Take 2 mg by mouth 2 (two) times daily.    [provider]  triamterene-hydrochlorothiazide (DYAZIDE) 37.5-25 MG per capsule Take 1 capsule by mouth daily.    [provider]  vitamin B-12 (CYANOCOBALAMIN) 1000 MCG tablet Take 1,000 mcg by mouth every morning.     [provider]    Family History Family History  Problem Relation Age of Onset  . Kidney failure Mother     Social History Social History   Tobacco Use  . Smoking status: Current Every Day Smoker    Types: Cigarettes  . Smokeless tobacco: Never Used  Substance Use Topics  . Alcohol use: No  . Drug use: No     Allergies   Doxycycline; Gabapentin; Other; Polymyxin b; and Tape   Review of Systems Review of Systems  Respiratory: Positive for cough.   All other systems reviewed and are negative.    Physical Exam Updated Vital Signs BP (!) 125/55   Pulse 74   Temp 98.9 F (37.2 C) (Oral)   Resp 18   Ht 5\' 2"  (1.575 m)   Wt 77.1 kg   SpO2 100%   BMI 31.09 kg/m   Physical Exam Vitals signs and nursing note reviewed.  Constitutional:      General: She is not in acute distress.     Appearance: She is well-developed.  HENT:     Head: Normocephalic and atraumatic.     Right Ear: Tympanic membrane normal.     Left Ear: Tympanic membrane normal.     Nose: Congestion and rhinorrhea present.     Right Sinus: Maxillary sinus tenderness and frontal sinus tenderness present.     Left Sinus: Maxillary sinus tenderness present.     Mouth/Throat:     Pharynx: No posterior oropharyngeal erythema.     Tonsils: Tonsillar exudate present.  Eyes:     Pupils: Pupils are equal, round, and reactive to light.  Cardiovascular:     Rate and Rhythm: Normal rate and regular rhythm.     Heart sounds: Normal heart sounds. No murmur. No friction rub.  Pulmonary:     Effort: Pulmonary effort is normal.     Breath sounds: Normal breath sounds. No wheezing or rales.  Abdominal:     General: Bowel sounds are normal. There is no distension.     Palpations: Abdomen is soft.     Tenderness: There is no abdominal tenderness. There is no guarding or rebound.  Musculoskeletal: Normal range of motion.        General: No tenderness.     Comments: No edema  Skin:    General: Skin is warm and dry.     Capillary Refill: Capillary refill takes 2 to 3 seconds.     Findings: No rash.  Neurological:     Mental Status: She is alert and oriented to person, place, and time.     Cranial Nerves: No cranial nerve deficit.  Psychiatric:        Behavior: Behavior normal.      ED Treatments / Results  Labs (all labs ordered are listed, but only abnormal results are displayed) Labs Reviewed - No data to display  EKG None  Radiology Dg Chest 2 View  Result Date: 06/25/2018 CLINICAL DATA:  Cough, fever, and chest congestion for 3 weeks. Former smoker.  EXAM: CHEST - 2 VIEW COMPARISON:  06/25/2017 FINDINGS: The heart size and mediastinal contours are within normal limits. Aortic atherosclerosis. Both lungs are clear. Mild thoracic spine degenerative changes again noted. IMPRESSION: No active  cardiopulmonary disease. Electronically Signed   By: Earle Gell M.D.   On: 06/25/2018 19:13    Procedures Procedures (including critical care time)  Medications Ordered in ED Medications  amoxicillin-clavulanate (AUGMENTIN) 875-125 MG per tablet 1 tablet (1 tablet Oral Given 06/25/18 2026)     Initial Impression / Assessment and Plan / ED Course  I have reviewed the triage vital signs and the nursing notes.  Pertinent labs & imaging results that were available during my care of the patient were reviewed by me and considered in my medical decision making (see chart for details).     Patient presenting with symptoms of sinusitis that have been ongoing now for 3 weeks.  She is nontoxic appearing and has normal vital signs on exam.  She has no wheezing or evidence of respiratory distress.  X-ray is within normal limits.  Patient encouraged to continue her nasal spray and sinus rinses and was given a course of Augmentin.  Final Clinical Impressions(s) / ED Diagnoses   Final diagnoses:  Subacute sinusitis, unspecified location    ED Discharge Orders         Ordered    amoxicillin-clavulanate (AUGMENTIN) 875-125 MG tablet  Every 12 hours     06/25/18 2020           Blanchie Dessert, MD 06/26/18 [phone]

## 2018-07-10 DIAGNOSIS — H04123 Dry eye syndrome of bilateral lacrimal glands: Secondary | ICD-10-CM | POA: Diagnosis not present

## 2018-07-17 DIAGNOSIS — N3941 Urge incontinence: Secondary | ICD-10-CM | POA: Diagnosis not present

## 2018-07-18 DIAGNOSIS — H26491 Other secondary cataract, right eye: Secondary | ICD-10-CM | POA: Diagnosis not present

## 2018-08-26 DIAGNOSIS — H04123 Dry eye syndrome of bilateral lacrimal glands: Secondary | ICD-10-CM | POA: Diagnosis not present

## 2018-08-26 DIAGNOSIS — H353132 Nonexudative age-related macular degeneration, bilateral, intermediate dry stage: Secondary | ICD-10-CM | POA: Diagnosis not present

## 2018-08-26 DIAGNOSIS — Z961 Presence of intraocular lens: Secondary | ICD-10-CM | POA: Diagnosis not present

## 2018-08-26 DIAGNOSIS — H1859 Other hereditary corneal dystrophies: Secondary | ICD-10-CM | POA: Diagnosis not present

## 2018-08-27 DIAGNOSIS — H04123 Dry eye syndrome of bilateral lacrimal glands: Secondary | ICD-10-CM | POA: Diagnosis not present

## 2018-08-27 DIAGNOSIS — H1859 Other hereditary corneal dystrophies: Secondary | ICD-10-CM | POA: Diagnosis not present

## 2018-08-27 DIAGNOSIS — Z961 Presence of intraocular lens: Secondary | ICD-10-CM | POA: Diagnosis not present

## 2018-08-27 DIAGNOSIS — H353132 Nonexudative age-related macular degeneration, bilateral, intermediate dry stage: Secondary | ICD-10-CM | POA: Diagnosis not present

## 2019-01-15 DIAGNOSIS — M5441 Lumbago with sciatica, right side: Secondary | ICD-10-CM | POA: Diagnosis not present

## 2019-01-15 DIAGNOSIS — Z79899 Other long term (current) drug therapy: Secondary | ICD-10-CM | POA: Diagnosis not present

## 2019-01-15 DIAGNOSIS — G603 Idiopathic progressive neuropathy: Secondary | ICD-10-CM | POA: Diagnosis not present

## 2019-01-15 DIAGNOSIS — G5603 Carpal tunnel syndrome, bilateral upper limbs: Secondary | ICD-10-CM | POA: Diagnosis not present

## 2019-01-15 DIAGNOSIS — M5442 Lumbago with sciatica, left side: Secondary | ICD-10-CM | POA: Diagnosis not present

## 2019-01-15 DIAGNOSIS — M542 Cervicalgia: Secondary | ICD-10-CM | POA: Diagnosis not present

## 2019-01-15 DIAGNOSIS — G250 Essential tremor: Secondary | ICD-10-CM | POA: Diagnosis not present

## 2019-01-18 IMAGING — MR MR KNEE*L* W/O CM
7 series · 40 of 40 positions shown · non-contrast
Comparison: Plain films left knee 09/16/2017.

CLINICAL DATA: Left knee pain, decreased range of motion, weakness
and intermittent popping for 5 months. No known injury.

EXAM:
MRI OF THE LEFT KNEE WITHOUT CONTRAST
TECHNIQUE: Multiplanar, multisequence MR imaging of the knee was performed. No
intravenous contrast was administered.

[Series 3: T2 fat-sat · axial · 4.0mm · 0.66mm/px · z∈[-66,+54]mm · 6 of 25 slices shown (1 of 3)]
[im 1/25]
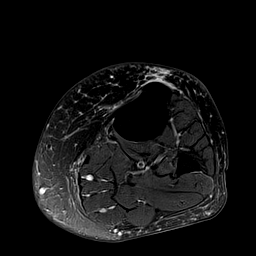
[im 5/25]
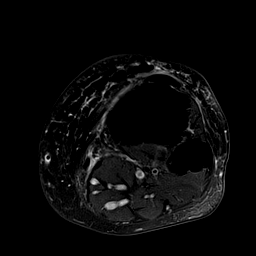
[im 10/25]
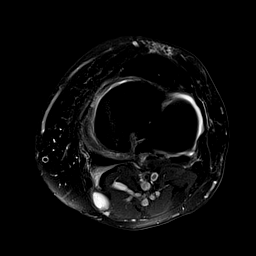
[im 15/25]
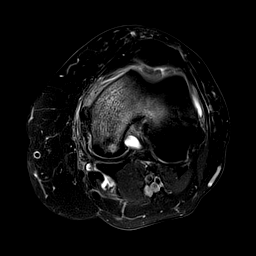
[im 20/25]
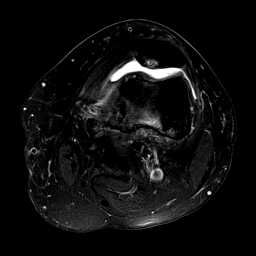
[im 25/25]
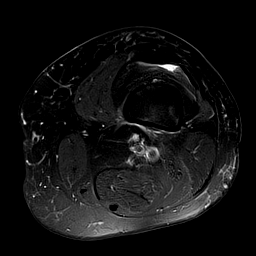

[Series 4: T1 · coronal · 4.0mm · 0.62mm/px · 6 of 27 slices shown]
[im 1/27]
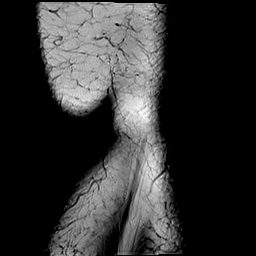
[im 6/27]
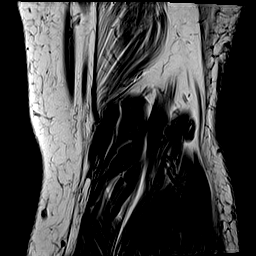
[im 11/27]
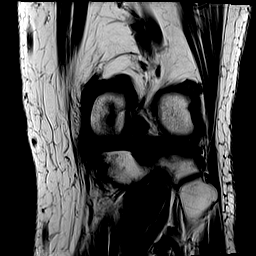
[im 16/27]
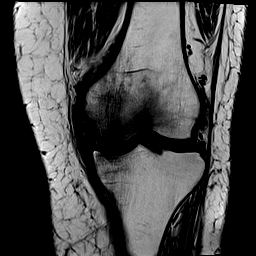
[im 21/27]
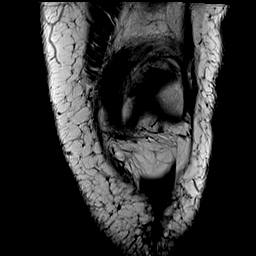
[im 27/27]
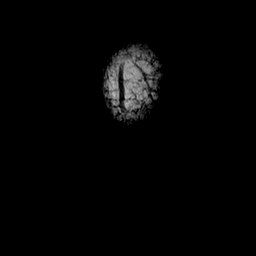

[Series 5: T2 fat-sat · coronal · 4.0mm · 0.62mm/px · 5 of 25 slices shown (2 of 3)]
[im 1/25]
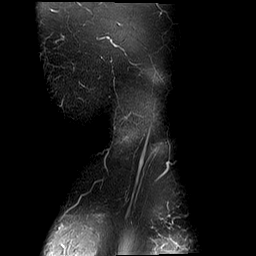
[im 7/25]
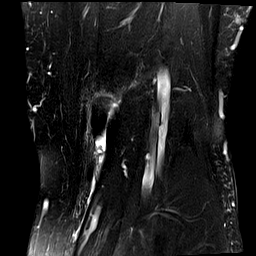
[im 13/25]
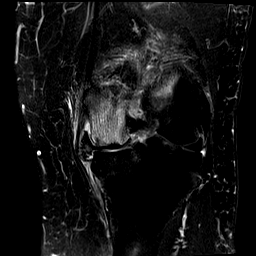
[im 19/25]
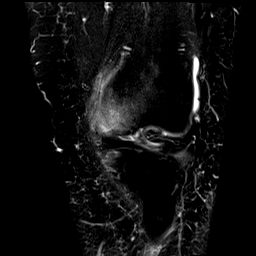
[im 25/25]
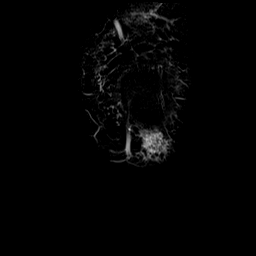

[Series 6: PD fat-sat · coronal · 3.0mm · 0.62mm/px · 7 of 32 slices shown (1 of 2)]
[im 1/32]
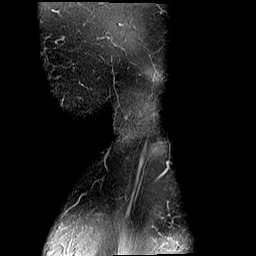
[im 6/32]
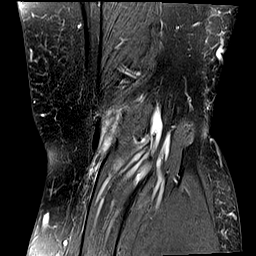
[im 11/32]
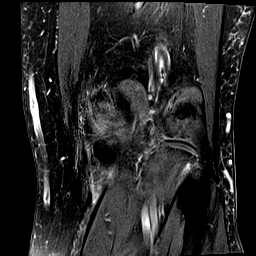
[im 16/32]
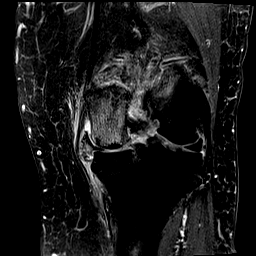
[im 21/32]
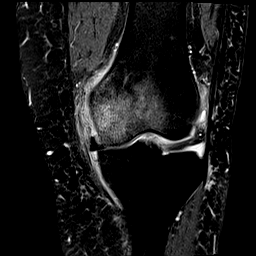
[im 26/32]
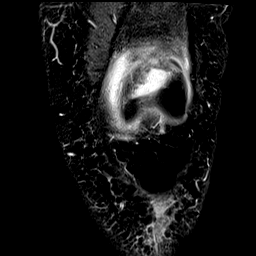
[im 32/32]
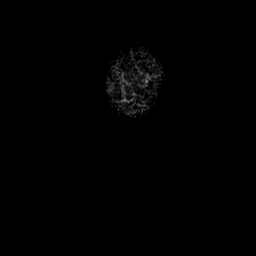

[Series 7: PD fat-sat · sagittal · 3.0mm · 0.62mm/px · 6 of 28 slices shown (2 of 2)]
[im 1/28]
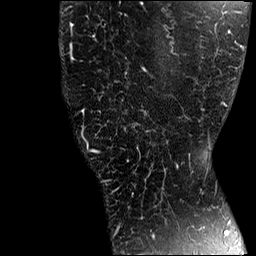
[im 6/28]
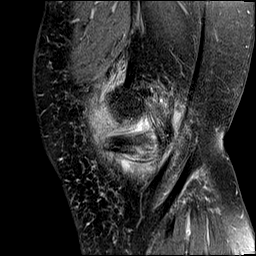
[im 11/28]
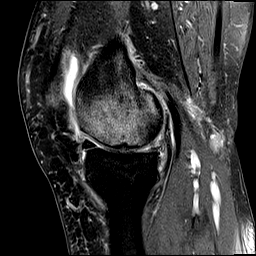
[im 17/28]
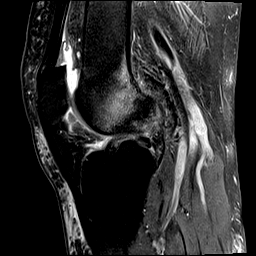
[im 22/28]
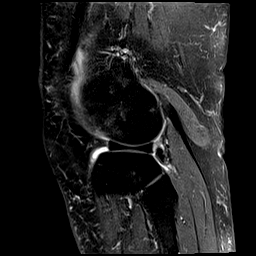
[im 28/28]
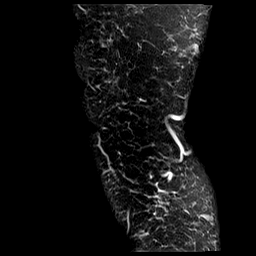

[Series 8: T2 fat-sat · sagittal · 3.0mm · 0.62mm/px · 6 of 28 slices shown (3 of 3)]
[im 1/28]
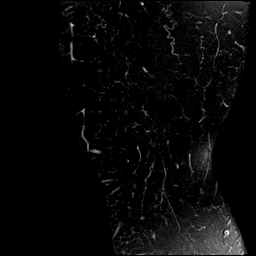
[im 6/28]
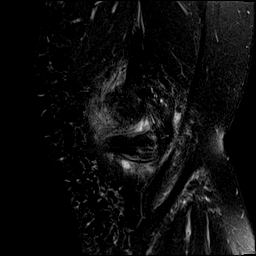
[im 11/28]
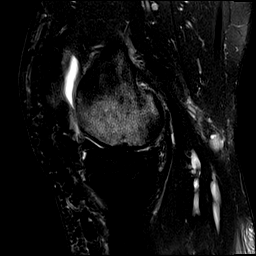
[im 17/28]
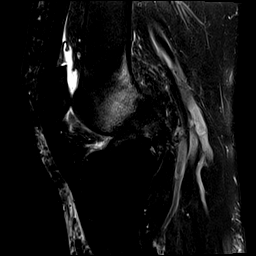
[im 22/28]
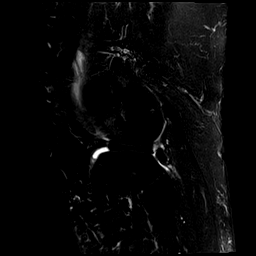
[im 28/28]
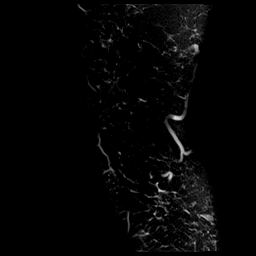

[Series 9: PD · oblique · 2.5mm · 0.62mm/px · 4 of 18 slices shown]
[im 1/18]
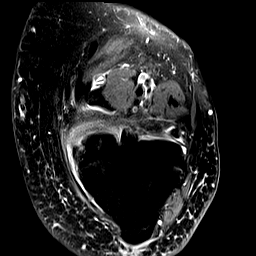
[im 6/18]
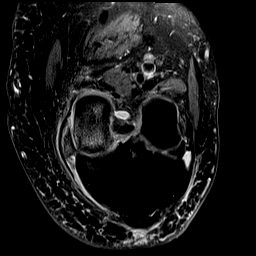
[im 12/18]
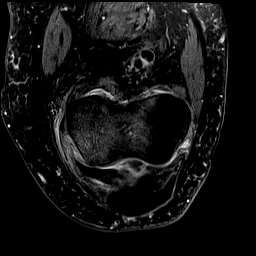
[im 18/18]
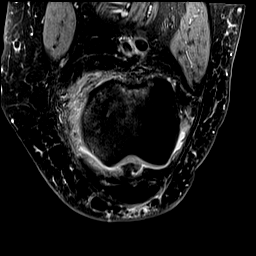

[40 of 40 positions shown; findings below may reference images not displayed]

FINDINGS: MENISCI

Medial meniscus: The posterior horn is severely degenerated and
diminutive with extensive complex tearing throughout. The body is
extruded peripherally out of the joint with a large complex tear in
the posterior body.

Lateral meniscus:  Intact.

LIGAMENTS

Cruciates:  Intact.

Collaterals:  Intact.

CARTILAGE

Patellofemoral: Cartilage along the superior pole of the patella at
the apex and along the medial facet is completely denuded. Thinning
is also seen along the lateral facet.

Medial:  Severely degenerated throughout.

Lateral:  Mildly degenerated.

Joint:  Small effusion.

Popliteal Fossa: Septated Baker's cyst measures 2.4 cm AP x 1 cm
transverse x 3 cm craniocaudal. Some fluid has dissected out of the
cyst along the medial gastrocnemius.

Extensor Mechanism:  Intact.

Bones: The patient has a subchondral fracture of the weight-bearing
medial femoral condyle with associated marrow edema. Subchondral
bone is mildly flattened.

Other: None.
IMPRESSION: Severe degeneration and complex tearing throughout the posterior
horn and posterior body of the medial meniscus. The body of the
medial meniscus is extruded peripherally.

Acute or subacute subchondral insufficiency or stress fracture
medial femoral condyle. Subchondral bone is mildly flattened and
extensive associated marrow edema is present.

Osteoarthritis about the knee appears worst in the patellofemoral
and medial compartments.

Small Baker's cyst.

## 2019-01-23 DIAGNOSIS — J301 Allergic rhinitis due to pollen: Secondary | ICD-10-CM | POA: Diagnosis not present

## 2019-01-23 DIAGNOSIS — F17210 Nicotine dependence, cigarettes, uncomplicated: Secondary | ICD-10-CM | POA: Diagnosis not present

## 2019-03-27 DIAGNOSIS — Z23 Encounter for immunization: Secondary | ICD-10-CM | POA: Diagnosis not present

## 2019-04-01 DIAGNOSIS — G250 Essential tremor: Secondary | ICD-10-CM | POA: Diagnosis not present

## 2019-04-01 DIAGNOSIS — Z79899 Other long term (current) drug therapy: Secondary | ICD-10-CM | POA: Diagnosis not present

## 2019-04-01 DIAGNOSIS — G603 Idiopathic progressive neuropathy: Secondary | ICD-10-CM | POA: Diagnosis not present

## 2019-04-01 DIAGNOSIS — M545 Low back pain: Secondary | ICD-10-CM | POA: Diagnosis not present

## 2019-04-01 DIAGNOSIS — F5101 Primary insomnia: Secondary | ICD-10-CM | POA: Diagnosis not present

## 2019-04-23 DIAGNOSIS — N3080 Other cystitis without hematuria: Secondary | ICD-10-CM | POA: Diagnosis not present

## 2019-04-23 DIAGNOSIS — N3941 Urge incontinence: Secondary | ICD-10-CM | POA: Diagnosis not present

## 2019-04-29 DIAGNOSIS — M5417 Radiculopathy, lumbosacral region: Secondary | ICD-10-CM | POA: Diagnosis not present

## 2019-04-29 DIAGNOSIS — F5101 Primary insomnia: Secondary | ICD-10-CM | POA: Diagnosis not present

## 2019-04-29 DIAGNOSIS — M5412 Radiculopathy, cervical region: Secondary | ICD-10-CM | POA: Diagnosis not present

## 2019-04-29 DIAGNOSIS — G5603 Carpal tunnel syndrome, bilateral upper limbs: Secondary | ICD-10-CM | POA: Diagnosis not present

## 2019-04-29 DIAGNOSIS — Z23 Encounter for immunization: Secondary | ICD-10-CM | POA: Diagnosis not present

## 2019-04-29 DIAGNOSIS — M1990 Unspecified osteoarthritis, unspecified site: Secondary | ICD-10-CM | POA: Diagnosis not present

## 2019-04-29 DIAGNOSIS — Z79899 Other long term (current) drug therapy: Secondary | ICD-10-CM | POA: Diagnosis not present

## 2019-04-29 DIAGNOSIS — G250 Essential tremor: Secondary | ICD-10-CM | POA: Diagnosis not present

## 2019-04-29 DIAGNOSIS — G603 Idiopathic progressive neuropathy: Secondary | ICD-10-CM | POA: Diagnosis not present

## 2019-05-08 DIAGNOSIS — L570 Actinic keratosis: Secondary | ICD-10-CM | POA: Diagnosis not present

## 2019-05-08 DIAGNOSIS — B078 Other viral warts: Secondary | ICD-10-CM | POA: Diagnosis not present

## 2019-05-08 DIAGNOSIS — N3941 Urge incontinence: Secondary | ICD-10-CM | POA: Diagnosis not present

## 2019-05-08 DIAGNOSIS — L820 Inflamed seborrheic keratosis: Secondary | ICD-10-CM | POA: Diagnosis not present

## 2019-05-27 DIAGNOSIS — F5101 Primary insomnia: Secondary | ICD-10-CM | POA: Diagnosis not present

## 2019-05-27 DIAGNOSIS — G603 Idiopathic progressive neuropathy: Secondary | ICD-10-CM | POA: Diagnosis not present

## 2019-05-27 DIAGNOSIS — G250 Essential tremor: Secondary | ICD-10-CM | POA: Diagnosis not present

## 2019-05-27 DIAGNOSIS — M545 Low back pain: Secondary | ICD-10-CM | POA: Diagnosis not present

## 2019-05-27 DIAGNOSIS — Z79899 Other long term (current) drug therapy: Secondary | ICD-10-CM | POA: Diagnosis not present

## 2019-06-10 DIAGNOSIS — M799 Soft tissue disorder, unspecified: Secondary | ICD-10-CM | POA: Diagnosis not present

## 2019-06-10 DIAGNOSIS — M545 Low back pain: Secondary | ICD-10-CM | POA: Diagnosis not present

## 2019-06-10 DIAGNOSIS — M6281 Muscle weakness (generalized): Secondary | ICD-10-CM | POA: Diagnosis not present

## 2019-06-10 DIAGNOSIS — R262 Difficulty in walking, not elsewhere classified: Secondary | ICD-10-CM | POA: Diagnosis not present

## 2019-06-12 DIAGNOSIS — M799 Soft tissue disorder, unspecified: Secondary | ICD-10-CM | POA: Diagnosis not present

## 2019-06-12 DIAGNOSIS — M6281 Muscle weakness (generalized): Secondary | ICD-10-CM | POA: Diagnosis not present

## 2019-06-12 DIAGNOSIS — R262 Difficulty in walking, not elsewhere classified: Secondary | ICD-10-CM | POA: Diagnosis not present

## 2019-06-12 DIAGNOSIS — M545 Low back pain: Secondary | ICD-10-CM | POA: Diagnosis not present

## 2019-08-05 DIAGNOSIS — Z961 Presence of intraocular lens: Secondary | ICD-10-CM | POA: Diagnosis not present

## 2019-08-05 DIAGNOSIS — H353132 Nonexudative age-related macular degeneration, bilateral, intermediate dry stage: Secondary | ICD-10-CM | POA: Diagnosis not present

## 2019-08-05 DIAGNOSIS — H04129 Dry eye syndrome of unspecified lacrimal gland: Secondary | ICD-10-CM | POA: Diagnosis not present

## 2019-08-05 DIAGNOSIS — Z9841 Cataract extraction status, right eye: Secondary | ICD-10-CM | POA: Diagnosis not present

## 2019-08-05 DIAGNOSIS — H18599 Other hereditary corneal dystrophies, unspecified eye: Secondary | ICD-10-CM | POA: Diagnosis not present

## 2019-08-05 DIAGNOSIS — H18529 Epithelial (juvenile) corneal dystrophy, unspecified eye: Secondary | ICD-10-CM | POA: Diagnosis not present

## 2019-08-05 DIAGNOSIS — Z9889 Other specified postprocedural states: Secondary | ICD-10-CM | POA: Diagnosis not present

## 2019-08-05 DIAGNOSIS — Z9842 Cataract extraction status, left eye: Secondary | ICD-10-CM | POA: Diagnosis not present

## 2019-08-05 DIAGNOSIS — H04123 Dry eye syndrome of bilateral lacrimal glands: Secondary | ICD-10-CM | POA: Diagnosis not present

## 2019-08-06 DIAGNOSIS — M2550 Pain in unspecified joint: Secondary | ICD-10-CM | POA: Diagnosis not present

## 2019-08-06 DIAGNOSIS — G250 Essential tremor: Secondary | ICD-10-CM | POA: Diagnosis not present

## 2019-08-06 DIAGNOSIS — G4733 Obstructive sleep apnea (adult) (pediatric): Secondary | ICD-10-CM | POA: Diagnosis not present

## 2019-08-06 DIAGNOSIS — Z79899 Other long term (current) drug therapy: Secondary | ICD-10-CM | POA: Diagnosis not present

## 2019-08-06 DIAGNOSIS — M542 Cervicalgia: Secondary | ICD-10-CM | POA: Diagnosis not present

## 2019-08-06 DIAGNOSIS — M545 Low back pain: Secondary | ICD-10-CM | POA: Diagnosis not present

## 2019-08-20 DIAGNOSIS — G4719 Other hypersomnia: Secondary | ICD-10-CM | POA: Diagnosis not present

## 2019-08-20 DIAGNOSIS — Z9989 Dependence on other enabling machines and devices: Secondary | ICD-10-CM | POA: Diagnosis not present

## 2019-08-20 DIAGNOSIS — F17210 Nicotine dependence, cigarettes, uncomplicated: Secondary | ICD-10-CM | POA: Diagnosis not present

## 2019-08-20 DIAGNOSIS — G4733 Obstructive sleep apnea (adult) (pediatric): Secondary | ICD-10-CM | POA: Diagnosis not present

## 2019-08-27 ENCOUNTER — Emergency Department (HOSPITAL_BASED_OUTPATIENT_CLINIC_OR_DEPARTMENT_OTHER)
Admission: EM | Admit: 2019-08-27 | Discharge: 2019-08-27 | Disposition: A | Discharge status: Home / Self Care | Payer: Medicare Other | Attending: Emergency Medicine | Admitting: Emergency Medicine

## 2019-08-27 ENCOUNTER — Encounter (HOSPITAL_BASED_OUTPATIENT_CLINIC_OR_DEPARTMENT_OTHER): Payer: Self-pay

## 2019-08-27 ENCOUNTER — Other Ambulatory Visit: Payer: Self-pay

## 2019-08-27 ENCOUNTER — Emergency Department (HOSPITAL_BASED_OUTPATIENT_CLINIC_OR_DEPARTMENT_OTHER): Payer: Medicare Other

## 2019-08-27 DIAGNOSIS — Z79899 Other long term (current) drug therapy: Secondary | ICD-10-CM | POA: Insufficient documentation

## 2019-08-27 DIAGNOSIS — E785 Hyperlipidemia, unspecified: Secondary | ICD-10-CM | POA: Insufficient documentation

## 2019-08-27 DIAGNOSIS — Z886 Allergy status to analgesic agent status: Secondary | ICD-10-CM | POA: Diagnosis not present

## 2019-08-27 DIAGNOSIS — M79662 Pain in left lower leg: Secondary | ICD-10-CM | POA: Diagnosis not present

## 2019-08-27 DIAGNOSIS — M25562 Pain in left knee: Secondary | ICD-10-CM | POA: Diagnosis not present

## 2019-08-27 DIAGNOSIS — Z881 Allergy status to other antibiotic agents status: Secondary | ICD-10-CM | POA: Diagnosis not present

## 2019-08-27 DIAGNOSIS — F17210 Nicotine dependence, cigarettes, uncomplicated: Secondary | ICD-10-CM | POA: Insufficient documentation

## 2019-08-27 DIAGNOSIS — M79605 Pain in left leg: Secondary | ICD-10-CM | POA: Diagnosis present

## 2019-08-27 DIAGNOSIS — M79652 Pain in left thigh: Secondary | ICD-10-CM | POA: Diagnosis not present

## 2019-08-27 HISTORY — DX: Synovial cyst of popliteal space (Baker), unspecified knee: M71.20

## 2019-08-27 MED ORDER — PREDNISONE 20 MG PO TABS
40.0000 mg | ORAL_TABLET | Freq: Once | ORAL | Status: AC
  Administered 2019-08-27: 40 mg via ORAL
  Filled 2019-08-27: qty 2

## 2019-08-27 MED ORDER — PREDNISONE 20 MG PO TABS: 40.0000 mg | ORAL_TABLET | Freq: Every day | ORAL | 0 refills | Status: AC

## 2019-08-27 NOTE — ED Triage Notes (Signed)
Pt c/o lower back and left LE pain-denies injury-pt states she has peripheral neuropathy and sees neuro for pain-NAD-to triage in w/c

## 2019-08-27 NOTE — Discharge Instructions (Signed)
Your ultrasound today showed no sign of DVT or Baker's cyst.  You most likely have arthritis in your knee and you were given referral to the orthopedist.  You can call their office tomorrow for a follow-up appointment.  You were given your first dose of steroids tonight but a prescription was sent to your pharmacy for the next 4 days.

## 2019-08-27 NOTE — ED Provider Notes (Signed)
[facility] DEPARTMENT Provider Note   CSN: [id number] Arrival date & time: 08/27/19  1910     History Chief Complaint  Patient presents with  . Back Pain    [name] [name] is a 74 y.o. female.  Patient is a 74 year old female with multiple medical problems including chronic back pain, prior Baker's cyst in her left knee with intermittent pain who presents today with a 5-day history of worsening pain in her left leg.  She states it is most severe around the knee and is much worse with walking.  However she also has the pain that radiates up into her back.  She has been taking Tylenol and Flexeril but it does not seem to be helping.  She denies any weakness of the leg and has not noticed any swelling.  The history is provided by the patient.  Back Pain      Past Medical History:  Diagnosis Date  . Baker's cyst   . Depression   . Deviated septum   . Diverticulitis 2014  . Hearing loss   . Left knee pain 09/20/2017  . Sinus drainage   . Skin cancer   . Sleep apnea     Patient Active Problem List   Diagnosis Date Noted  . Deviated septum 09/20/2017  . Left leg pain 09/20/2017  . Nuclear sclerosis, left 11/03/2015  . Pseudophakia of both eyes 08/12/2015  . Regular astigmatism of right eye 08/11/2015  . RPE mottling of macula 06/03/2015  . Insufficiency of tear film of both eyes 06/03/2015  . 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 Polypharmacy 06/04/2014  . Encounter for long-term (current) use of medications 04/23/2014  . Hyperlipidemia 03/21/2014  . Peripheral neuropathy 09/27/2013  . 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 Cervical neuropathy 09/27/2013  . Spider veins 09/03/2013  . Bilateral leg edema 09/03/2013  . Benign essential hypertension 04/18/2013  . Allergic conjunctivitis of both eyes 02/28/2013  . Insomnia 02/21/2013  . Lumbar spinal stenosis 01/31/2013  . Chronic sphenoidal sinusitis 01/31/2013  . Senile nuclear sclerosis 01/28/2013   . Dry eye syndrome 01/28/2013  . 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 Blepharitis of both eyes 01/28/2013  . Intervertebral disc protrusion 01/20/2013  . Hearing loss 01/20/2013  . Transaminitis 11/19/2012  . Diverticulitis 11/14/2012  . 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 Obstructive sleep apnea 01/18/2012  . 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 Hip arthritis 11/19/2011  . Osteopenia 11/16/2011  . Vitamin D deficiency 11/13/2011  . Tobacco abuse 10/23/2011  . Chronic neck pain 10/23/2011  . Anxiety 10/23/2011    Past Surgical History:  Procedure Laterality Date  . APPENDECTOMY    . BACK SURGERY    . BREAST SURGERY    . CESAREAN SECTION    . EYE SURGERY    . FOOT SURGERY    . FOOT SURGERY    . FOOT SURGERY Left   . MOHS SURGERY    . NECK SURGERY       OB History   No obstetric history on file.     Family History  Problem Relation Age of Onset  . Kidney failure Mother     Social History   Tobacco Use  . Smoking status: Current Every Day Smoker    Types: Cigarettes  . Smokeless tobacco: Never Used  Substance Use Topics  . Alcohol use: No  . Drug use: No    Home Medications Prior to Admission medications  Medication Sig Start Date End Date Taking? Authorizing Provider  albuterol (PROVENTIL HFA;VENTOLIN HFA) 108 (90 BASE) MCG/ACT inhaler Inhale 2 puffs into the lungs every 6 (six) hours as needed for wheezing.    [provider]  alendronate (FOSAMAX) 70 MG tablet Take 70 mg by mouth every 30 (thirty) days. Take with a full glass of water on an empty stomach on Sundays     [provider]  ALPRAZolam Duanne Moron) 0.5 MG tablet Take 0.5 mg by mouth 2 (two) times daily.    [provider]  amoxicillin-clavulanate (AUGMENTIN) 875-125 MG tablet Take 1 tablet by mouth every 12 (twelve) hours. 06/25/18   Blanchie Dessert, MD  Armodafinil (NUVIGIL) 250 MG tablet Take 250 mg by mouth daily.     [provider]  aspirin 81 MG tablet Take 81 mg by mouth every morning. Take an additional tablet if needed for leg cramps in the evening    [provider]  atorvastatin (LIPITOR) 20 MG tablet Take 20 mg by mouth at bedtime. 01/10/18   [provider]  Bilberry 1000 MG CAPS Take 1 tablet by mouth every morning.     [provider]  budesonide-formoterol (SYMBICORT) 160-4.5 MCG/ACT inhaler Inhale 2 puffs into the lungs daily as needed (for wheezing).    [provider]  buPROPion (WELLBUTRIN SR) 150 MG 12 hr tablet Take 150 mg by mouth 2 (two) times daily.    [provider]  cholecalciferol (VITAMIN D) 1000 UNITS tablet Take 1,000 Units by mouth every morning.    [provider]  docusate sodium (COLACE) 100 MG capsule Take 1 capsule (100 mg total) by mouth every 12 (twelve) hours. 04/23/16   Long, Wonda Olds, MD  DULoxetine (CYMBALTA) 60 MG capsule Take 60 mg by mouth daily.    [provider]  fexofenadine (ALLEGRA) 180 MG tablet TAKE 1 TABLET (180 MG TOTAL) BY MOUTH DAILY. 08/29/17   [provider]  fluticasone (FLONASE) 50 MCG/ACT nasal spray Place 1 spray into both nostrils daily. 06/25/17   Khatri, Hina, PA-C  furosemide (LASIX) 20 MG tablet Take 20 mg by mouth.    [provider]  ibandronate (BONIVA) 150 MG tablet  12/19/17   [provider]  ipratropium (ATROVENT) 0.06 % nasal spray 2 SPRAYS BY NASAL ROUTE 2 (TWO) TIMES DAILY AS NEEDED FOR UP TO 30 DAYS FOR RHINITIS. 01/21/18   [provider]  KLOR-CON M10 10 MEQ tablet Take 10 mEq by mouth 2 (two) times daily. 01/21/18   [provider]  lidocaine (LIDODERM) 5 % Place 1 patch onto the skin daily. Remove & Discard patch within 12 hours or as directed by MD 04/23/16   Long, Wonda Olds, MD  Magnesium 500 MG TABS Take 1 tablet by mouth every morning.     [provider]  Multiple Vitamin (MULTIVITAMIN WITH MINERALS) TABS  Take 1 tablet by mouth every morning.    [provider]  MYRBETRIQ 50 MG TB24 tablet  12/05/17   [provider]  Olopatadine HCl 0.2 % SOLN  09/13/17   [provider]  oxyCODONE (OXY IR/ROXICODONE) 5 MG immediate release tablet TAKE 1 TABLET BY MOUTH 3 TIMES A DAY AS NEEDED FOR 30 DAYS 08/07/17   [provider]  pantoprazole (PROTONIX) 40 MG tablet Take 1 tablet (40 mg total) by mouth daily. 11/21/12   Riebock, Estill Bakes, NP  Potassium Chloride ER 20 MEQ TBCR Take 1 tablet by mouth daily. 10/11/17  Dene Gentry, MD  primidone (MYSOLINE) 50 MG tablet PLEASE SEE ATTACHED FOR DETAILED DIRECTIONS 08/07/17   [provider]  sodium chloride (MURO 128) 2 % ophthalmic solution Place 1 drop into both eyes every morning.    [provider]  solifenacin (VESICARE) 10 MG tablet Take by mouth daily.    [provider]  tiZANidine (ZANAFLEX) 4 MG tablet Take 4 mg by mouth every 6 (six) hours as needed for muscle spasms.    [provider]  tolterodine (DETROL) 2 MG tablet Take 2 mg by mouth 2 (two) times daily.    [provider]  triamterene-hydrochlorothiazide (DYAZIDE) 37.5-25 MG per capsule Take 1 capsule by mouth daily.    [provider]  vitamin B-12 (CYANOCOBALAMIN) 1000 MCG tablet Take 1,000 mcg by mouth every morning.     [provider]    Allergies    Doxycycline, Gabapentin, Other, Polymyxin b, and Tape  Review of Systems   Review of Systems  Musculoskeletal: Positive for back pain.  All other systems reviewed and are negative.   Physical Exam Updated Vital Signs BP (!) 112/54 (BP Location: Left Arm)   Pulse 90   Temp 98 F (36.7 C) (Oral)   Resp 18   SpO2 100%   Physical Exam Vitals and nursing note reviewed.  Constitutional:      General: She is not in acute distress.    Appearance: Normal appearance. She is well-developed and normal weight.  HENT:     Head: Normocephalic and  atraumatic.  Eyes:     Pupils: Pupils are equal, round, and reactive to light.  Cardiovascular:     Rate and Rhythm: Normal rate and regular rhythm.     Heart sounds: Normal heart sounds. No murmur. No friction rub.  Pulmonary:     Effort: Pulmonary effort is normal.     Breath sounds: Normal breath sounds. No wheezing or rales.  Abdominal:     General: Bowel sounds are normal. There is no distension.     Palpations: Abdomen is soft.     Tenderness: There is no abdominal tenderness. There is no guarding or rebound.  Musculoskeletal:        General: Tenderness present. Normal range of motion.     Left knee: Bony tenderness present. No swelling, deformity or effusion. Normal range of motion. Tenderness present over the medial joint line and PCL.     Right lower leg: No edema.     Left lower leg: No edema.     Comments: No edema.  Tenderness in the right medial thigh and also positive Homans' sign  Skin:    General: Skin is warm and dry.     Findings: No rash.  Neurological:     Mental Status: She is alert and oriented to person, place, and time.     Cranial Nerves: No cranial nerve deficit.     Sensory: No sensory deficit.     Motor: No weakness.  Psychiatric:        Mood and Affect: Mood normal.        Behavior: Behavior normal.        Thought Content: Thought content normal.     ED Results / Procedures / Treatments   Labs (all labs ordered are listed, but only abnormal results are displayed) Labs Reviewed - No data to display  EKG None  Radiology US Venous Img Lower  Left (DVT Study)  Result Date: 08/27/2019 CLINICAL DATA:  Left  thigh and calf pain EXAM: Left LOWER EXTREMITY VENOUS DOPPLER ULTRASOUND TECHNIQUE: Gray-scale sonography with compression, as well as color and duplex ultrasound, were performed to evaluate the deep venous system(s) from the level of the common femoral vein through the popliteal and proximal calf veins. COMPARISON:  None. FINDINGS: VENOUS Normal  compressibility of the common femoral, superficial femoral, and popliteal veins, as well as the visualized calf veins. Visualized portions of profunda femoral vein and great saphenous vein unremarkable. No filling defects to suggest DVT on grayscale or color Doppler imaging. Doppler waveforms show normal direction of venous flow, normal respiratory phasicity and response to augmentation. Limited views of the contralateral common femoral vein are unremarkable. OTHER None. Limitations: none IMPRESSION: No femoropopliteal DVT nor evidence of DVT within the visualized calf veins. If clinical symptoms are inconsistent or if there are persistent or worsening symptoms, further imaging (possibly involving the iliac veins) may be warranted. Electronically Signed   By: Randa Ngo M.D.   On: 08/27/2019 21:03    Procedures Procedures (including critical care time)  Medications Ordered in ED Medications - No data to display  ED Course  I have reviewed the triage vital signs and the nursing notes.  Pertinent labs & imaging results that were available during my care of the patient were reviewed by me and considered in my medical decision making (see chart for details).    MDM Rules/Calculators/A&P                      Elderly female presenting today with worsening right leg pain most localized at the knee with additional back pain.  Patient has known chronic back disease symptoms could be radiculopathy today however also patient has had a ruptured left Baker's cyst in the past as well which could be causing pain.  Also possibility for DVT as patient does have pain in the medial thigh as well as a positive Homans' sign.  Patient denies any injury and has no notable weakness to the leg. We will do ultrasound to further evaluate.  9:41 PM Ultrasound is negative for DVT or other acute findings.  Findings discussed with the patient.  Suspect arthritis as the cause of pain in her right knee.  No evidence of  septic arthritis or significant joint effusion.  Also could be radicular in nature.  Patient given 5-day course of steroids to hopefully help with pain control.  Also given referral to orthopedist.  Final Clinical Impression(s) / ED Diagnoses Final diagnoses:  Acute pain of left knee    Rx / DC Orders ED Discharge Orders         Ordered    predniSONE (DELTASONE) 20 MG tablet  Daily     08/27/19 2138           Blanchie Dessert, MD 08/27/19 2141

## 2019-09-08 DIAGNOSIS — M25561 Pain in right knee: Secondary | ICD-10-CM | POA: Diagnosis not present

## 2019-09-08 DIAGNOSIS — M1712 Unilateral primary osteoarthritis, left knee: Secondary | ICD-10-CM | POA: Diagnosis not present

## 2019-09-19 DIAGNOSIS — M1711 Unilateral primary osteoarthritis, right knee: Secondary | ICD-10-CM | POA: Diagnosis not present

## 2019-10-14 DIAGNOSIS — R201 Hypoesthesia of skin: Secondary | ICD-10-CM | POA: Diagnosis not present

## 2019-10-14 DIAGNOSIS — F419 Anxiety disorder, unspecified: Secondary | ICD-10-CM | POA: Diagnosis not present

## 2019-10-14 DIAGNOSIS — F5101 Primary insomnia: Secondary | ICD-10-CM | POA: Diagnosis not present

## 2019-10-14 DIAGNOSIS — M545 Low back pain: Secondary | ICD-10-CM | POA: Diagnosis not present

## 2019-10-14 DIAGNOSIS — M542 Cervicalgia: Secondary | ICD-10-CM | POA: Diagnosis not present

## 2019-10-14 DIAGNOSIS — G250 Essential tremor: Secondary | ICD-10-CM | POA: Diagnosis not present

## 2020-02-05 DIAGNOSIS — M542 Cervicalgia: Secondary | ICD-10-CM | POA: Diagnosis not present

## 2020-02-05 DIAGNOSIS — M545 Low back pain: Secondary | ICD-10-CM | POA: Diagnosis not present

## 2020-02-05 DIAGNOSIS — Z79899 Other long term (current) drug therapy: Secondary | ICD-10-CM | POA: Diagnosis not present

## 2020-02-05 DIAGNOSIS — G250 Essential tremor: Secondary | ICD-10-CM | POA: Diagnosis not present

## 2020-02-05 DIAGNOSIS — F419 Anxiety disorder, unspecified: Secondary | ICD-10-CM | POA: Diagnosis not present

## 2020-02-05 DIAGNOSIS — F5101 Primary insomnia: Secondary | ICD-10-CM | POA: Diagnosis not present

## 2020-02-05 DIAGNOSIS — R5383 Other fatigue: Secondary | ICD-10-CM | POA: Diagnosis not present

## 2020-02-19 DIAGNOSIS — M1712 Unilateral primary osteoarthritis, left knee: Secondary | ICD-10-CM | POA: Diagnosis not present

## 2020-04-08 DIAGNOSIS — M170 Bilateral primary osteoarthritis of knee: Secondary | ICD-10-CM | POA: Diagnosis not present

## 2020-05-04 DIAGNOSIS — Z1211 Encounter for screening for malignant neoplasm of colon: Secondary | ICD-10-CM | POA: Diagnosis not present

## 2020-05-04 DIAGNOSIS — F17210 Nicotine dependence, cigarettes, uncomplicated: Secondary | ICD-10-CM | POA: Diagnosis not present

## 2020-05-04 DIAGNOSIS — Z1231 Encounter for screening mammogram for malignant neoplasm of breast: Secondary | ICD-10-CM | POA: Diagnosis not present

## 2020-05-04 DIAGNOSIS — E782 Mixed hyperlipidemia: Secondary | ICD-10-CM | POA: Diagnosis not present

## 2020-05-04 DIAGNOSIS — Z79899 Other long term (current) drug therapy: Secondary | ICD-10-CM | POA: Diagnosis not present

## 2020-05-04 DIAGNOSIS — D508 Other iron deficiency anemias: Secondary | ICD-10-CM | POA: Diagnosis not present

## 2020-05-04 DIAGNOSIS — E559 Vitamin D deficiency, unspecified: Secondary | ICD-10-CM | POA: Diagnosis not present

## 2020-05-04 DIAGNOSIS — Z1321 Encounter for screening for nutritional disorder: Secondary | ICD-10-CM | POA: Diagnosis not present

## 2020-05-04 DIAGNOSIS — Z131 Encounter for screening for diabetes mellitus: Secondary | ICD-10-CM | POA: Diagnosis not present

## 2020-05-04 DIAGNOSIS — Z6834 Body mass index (BMI) 34.0-34.9, adult: Secondary | ICD-10-CM | POA: Diagnosis not present

## 2020-05-04 DIAGNOSIS — R600 Localized edema: Secondary | ICD-10-CM | POA: Diagnosis not present

## 2020-05-04 DIAGNOSIS — G4733 Obstructive sleep apnea (adult) (pediatric): Secondary | ICD-10-CM | POA: Diagnosis not present

## 2020-05-04 DIAGNOSIS — E6609 Other obesity due to excess calories: Secondary | ICD-10-CM | POA: Diagnosis not present

## 2020-05-04 DIAGNOSIS — G47419 Narcolepsy without cataplexy: Secondary | ICD-10-CM | POA: Diagnosis not present

## 2020-05-04 DIAGNOSIS — Z0001 Encounter for general adult medical examination with abnormal findings: Secondary | ICD-10-CM | POA: Diagnosis not present

## 2020-05-04 DIAGNOSIS — R251 Tremor, unspecified: Secondary | ICD-10-CM | POA: Diagnosis not present

## 2020-05-11 DIAGNOSIS — M542 Cervicalgia: Secondary | ICD-10-CM | POA: Diagnosis not present

## 2020-05-11 DIAGNOSIS — G250 Essential tremor: Secondary | ICD-10-CM | POA: Diagnosis not present

## 2020-05-11 DIAGNOSIS — Z79899 Other long term (current) drug therapy: Secondary | ICD-10-CM | POA: Diagnosis not present

## 2020-05-11 DIAGNOSIS — F5101 Primary insomnia: Secondary | ICD-10-CM | POA: Diagnosis not present

## 2020-05-11 DIAGNOSIS — M545 Low back pain, unspecified: Secondary | ICD-10-CM | POA: Diagnosis not present

## 2020-05-24 DIAGNOSIS — L820 Inflamed seborrheic keratosis: Secondary | ICD-10-CM | POA: Diagnosis not present

## 2020-05-24 DIAGNOSIS — L570 Actinic keratosis: Secondary | ICD-10-CM | POA: Diagnosis not present

## 2020-05-24 DIAGNOSIS — L853 Xerosis cutis: Secondary | ICD-10-CM | POA: Diagnosis not present

## 2020-06-10 DIAGNOSIS — D692 Other nonthrombocytopenic purpura: Secondary | ICD-10-CM | POA: Diagnosis not present

## 2020-06-10 DIAGNOSIS — R0981 Nasal congestion: Secondary | ICD-10-CM | POA: Diagnosis not present

## 2020-06-10 DIAGNOSIS — Z0000 Encounter for general adult medical examination without abnormal findings: Secondary | ICD-10-CM | POA: Diagnosis not present

## 2020-06-10 DIAGNOSIS — I509 Heart failure, unspecified: Secondary | ICD-10-CM | POA: Diagnosis not present

## 2020-06-10 DIAGNOSIS — G4733 Obstructive sleep apnea (adult) (pediatric): Secondary | ICD-10-CM | POA: Diagnosis not present

## 2020-06-10 DIAGNOSIS — R600 Localized edema: Secondary | ICD-10-CM | POA: Diagnosis not present

## 2020-06-10 DIAGNOSIS — G47419 Narcolepsy without cataplexy: Secondary | ICD-10-CM | POA: Diagnosis not present

## 2020-06-10 DIAGNOSIS — G4737 Central sleep apnea in conditions classified elsewhere: Secondary | ICD-10-CM | POA: Diagnosis not present

## 2020-10-26 ENCOUNTER — Other Ambulatory Visit: Payer: Self-pay

## 2020-10-26 ENCOUNTER — Emergency Department (HOSPITAL_BASED_OUTPATIENT_CLINIC_OR_DEPARTMENT_OTHER): Payer: No Typology Code available for payment source

## 2020-10-26 ENCOUNTER — Emergency Department (HOSPITAL_BASED_OUTPATIENT_CLINIC_OR_DEPARTMENT_OTHER)
Admission: EM | Admit: 2020-10-26 | Discharge: 2020-10-26 | Disposition: A | Discharge status: Home / Self Care | Payer: No Typology Code available for payment source | Attending: Emergency Medicine | Admitting: Emergency Medicine

## 2020-10-26 ENCOUNTER — Encounter (HOSPITAL_BASED_OUTPATIENT_CLINIC_OR_DEPARTMENT_OTHER): Payer: Self-pay

## 2020-10-26 DIAGNOSIS — R519 Headache, unspecified: Secondary | ICD-10-CM | POA: Insufficient documentation

## 2020-10-26 DIAGNOSIS — Z79899 Other long term (current) drug therapy: Secondary | ICD-10-CM | POA: Diagnosis not present

## 2020-10-26 DIAGNOSIS — S39012A Strain of muscle, fascia and tendon of lower back, initial encounter: Secondary | ICD-10-CM | POA: Insufficient documentation

## 2020-10-26 DIAGNOSIS — Z7982 Long term (current) use of aspirin: Secondary | ICD-10-CM | POA: Insufficient documentation

## 2020-10-26 DIAGNOSIS — S161XXA Strain of muscle, fascia and tendon at neck level, initial encounter: Secondary | ICD-10-CM | POA: Diagnosis not present

## 2020-10-26 DIAGNOSIS — S3992XA Unspecified injury of lower back, initial encounter: Secondary | ICD-10-CM | POA: Diagnosis present

## 2020-10-26 DIAGNOSIS — Z85828 Personal history of other malignant neoplasm of skin: Secondary | ICD-10-CM | POA: Insufficient documentation

## 2020-10-26 DIAGNOSIS — Y92410 Unspecified street and highway as the place of occurrence of the external cause: Secondary | ICD-10-CM | POA: Diagnosis not present

## 2020-10-26 DIAGNOSIS — F17210 Nicotine dependence, cigarettes, uncomplicated: Secondary | ICD-10-CM | POA: Diagnosis not present

## 2020-10-26 MED ORDER — KETOROLAC TROMETHAMINE 60 MG/2ML IM SOLN
30.0000 mg | Freq: Once | INTRAMUSCULAR | Status: AC
  Administered 2020-10-26: 30 mg via INTRAMUSCULAR
  Filled 2020-10-26: qty 2

## 2020-10-26 NOTE — Discharge Instructions (Signed)
Follow-up with your primary care doctor.  Take Tylenol or Motrin for pain control.
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
## 2020-10-26 NOTE — ED Notes (Signed)
ED Provider at bedside. 
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
## 2020-10-26 NOTE — ED Notes (Signed)
Patient transported to X-ray & CT °

## 2021-12-24 ENCOUNTER — Encounter (HOSPITAL_BASED_OUTPATIENT_CLINIC_OR_DEPARTMENT_OTHER): Payer: Self-pay | Admitting: Emergency Medicine

## 2021-12-24 ENCOUNTER — Emergency Department (HOSPITAL_BASED_OUTPATIENT_CLINIC_OR_DEPARTMENT_OTHER): Payer: Medicare Other

## 2021-12-24 ENCOUNTER — Emergency Department (HOSPITAL_BASED_OUTPATIENT_CLINIC_OR_DEPARTMENT_OTHER)
Admission: EM | Admit: 2021-12-24 | Discharge: 2021-12-24 | Disposition: A | Discharge status: Home / Self Care | Payer: Medicare Other | Attending: Emergency Medicine | Admitting: Emergency Medicine

## 2021-12-24 ENCOUNTER — Other Ambulatory Visit: Payer: Self-pay

## 2021-12-24 DIAGNOSIS — R1031 Right lower quadrant pain: Secondary | ICD-10-CM | POA: Insufficient documentation

## 2021-12-24 DIAGNOSIS — K6389 Other specified diseases of intestine: Secondary | ICD-10-CM

## 2021-12-24 DIAGNOSIS — E876 Hypokalemia: Secondary | ICD-10-CM

## 2021-12-24 LAB — COMPREHENSIVE METABOLIC PANEL
ALT: 28 U/L (ref 0–44)
AST: 23 U/L (ref 15–41)
Albumin: 3.2 g/dL — ABNORMAL LOW (ref 3.5–5.0)
Alkaline Phosphatase: 76 U/L (ref 38–126)
Anion gap: 8 (ref 5–15)
BUN: 16 mg/dL (ref 8–23)
CO2: 26 mmol/L (ref 22–32)
Calcium: 9 mg/dL (ref 8.9–10.3)
Chloride: 94 mmol/L — ABNORMAL LOW (ref 98–111)
Creatinine, Ser: 0.75 mg/dL (ref 0.44–1.00)
GFR, Estimated: >60 mL/min (ref >60)
Glucose, Bld: 92 mg/dL (ref 70–99)
Potassium: 2.5 mmol/L — CL (ref 3.5–5.1)
Sodium: 128 mmol/L — ABNORMAL LOW (ref 135–145)
Total Bilirubin: 0.2 mg/dL — ABNORMAL LOW (ref 0.3–1.2)
Total Protein: 6.5 g/dL (ref 6.5–8.1)

## 2021-12-24 LAB — URINALYSIS, ROUTINE W REFLEX MICROSCOPIC
Bilirubin Urine: NEGATIVE
Glucose, UA: NEGATIVE mg/dL
Hgb urine dipstick: NEGATIVE
Ketones, ur: NEGATIVE mg/dL
Leukocytes,Ua: NEGATIVE
Nitrite: NEGATIVE
Protein, ur: NEGATIVE mg/dL
Specific Gravity, Urine: 1.02 (ref 1.005–1.030)
pH: 7 (ref 5.0–8.0)

## 2021-12-24 LAB — CBC WITH DIFFERENTIAL/PLATELET
Abs Immature Granulocytes: 0.07 10*3/uL (ref 0.00–0.07)
Basophils Absolute: 0 10*3/uL (ref 0.0–0.1)
Basophils Relative: 0 %
Eosinophils Absolute: 0 10*3/uL (ref 0.0–0.5)
Eosinophils Relative: 0 %
HCT: 27.9 % — ABNORMAL LOW (ref 36.0–46.0)
Hemoglobin: 9.1 g/dL — ABNORMAL LOW (ref 12.0–15.0)
Immature Granulocytes: 1 %
Lymphocytes Relative: 14 %
Lymphs Abs: 1.8 10*3/uL (ref 0.7–4.0)
MCH: 26.9 pg (ref 26.0–34.0)
MCHC: 32.6 g/dL (ref 30.0–36.0)
MCV: 82.5 fL (ref 80.0–100.0)
Monocytes Absolute: 1 10*3/uL (ref 0.1–1.0)
Monocytes Relative: 7 %
Neutro Abs: 10.3 10*3/uL — ABNORMAL HIGH (ref 1.7–7.7)
Neutrophils Relative %: 78 %
Platelets: 462 10*3/uL — ABNORMAL HIGH (ref 150–400)
RBC: 3.38 MIL/uL — ABNORMAL LOW (ref 3.87–5.11)
RDW: 16.4 % — ABNORMAL HIGH (ref 11.5–15.5)
WBC: 13.2 10*3/uL — ABNORMAL HIGH (ref 4.0–10.5)
nRBC: 0 % (ref 0.0–0.2)

## 2021-12-24 LAB — LIPASE, BLOOD: Lipase: 23 U/L (ref 11–51)

## 2021-12-24 LAB — MAGNESIUM: Magnesium: 2 mg/dL (ref 1.7–2.4)

## 2021-12-24 MED ORDER — SODIUM CHLORIDE 0.9 % IV BOLUS
1000.0000 mL | Freq: Once | INTRAVENOUS | Status: AC
  Administered 2021-12-24: 1000 mL via INTRAVENOUS

## 2021-12-24 MED ORDER — POTASSIUM CHLORIDE CRYS ER 20 MEQ PO TBCR
40.0000 meq | EXTENDED_RELEASE_TABLET | Freq: Once | ORAL | Status: AC
  Administered 2021-12-24: 40 meq via ORAL
  Filled 2021-12-24: qty 2

## 2021-12-24 MED ORDER — MORPHINE SULFATE (PF) 4 MG/ML IV SOLN: 4.0000 mg | Freq: Once | INTRAVENOUS | Status: DC

## 2021-12-24 MED ORDER — POTASSIUM CHLORIDE CRYS ER 20 MEQ PO TBCR: 40.0000 meq | EXTENDED_RELEASE_TABLET | Freq: Two times a day (BID) | ORAL | 0 refills | Status: AC

## 2021-12-24 MED ORDER — IOHEXOL 300 MG/ML  SOLN
100.0000 mL | Freq: Once | INTRAMUSCULAR | Status: AC | PRN
  Administered 2021-12-24: 100 mL via INTRAVENOUS

## 2021-12-24 MED ORDER — POTASSIUM CHLORIDE 10 MEQ/100ML IV SOLN
10.0000 meq | INTRAVENOUS | Status: AC
  Administered 2021-12-24 (×3): 10 meq via INTRAVENOUS
  Filled 2021-12-24 (×2): qty 100
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
## 2022-01-03 ENCOUNTER — Emergency Department (HOSPITAL_BASED_OUTPATIENT_CLINIC_OR_DEPARTMENT_OTHER)
Admission: EM | Admit: 2022-01-03 | Discharge: 2022-01-03 | Disposition: A | Discharge status: Home / Self Care | Payer: Medicare Other | Attending: Emergency Medicine | Admitting: Emergency Medicine

## 2022-01-03 ENCOUNTER — Emergency Department (HOSPITAL_BASED_OUTPATIENT_CLINIC_OR_DEPARTMENT_OTHER): Payer: Medicare Other

## 2022-01-03 ENCOUNTER — Other Ambulatory Visit: Payer: Self-pay

## 2022-01-03 ENCOUNTER — Encounter (HOSPITAL_BASED_OUTPATIENT_CLINIC_OR_DEPARTMENT_OTHER): Payer: Self-pay | Admitting: Urology

## 2022-01-03 DIAGNOSIS — E871 Hypo-osmolality and hyponatremia: Secondary | ICD-10-CM

## 2022-01-03 DIAGNOSIS — Z23 Encounter for immunization: Secondary | ICD-10-CM | POA: Diagnosis not present

## 2022-01-03 DIAGNOSIS — S81011A Laceration without foreign body, right knee, initial encounter: Secondary | ICD-10-CM | POA: Insufficient documentation

## 2022-01-03 DIAGNOSIS — W19XXXA Unspecified fall, initial encounter: Secondary | ICD-10-CM | POA: Diagnosis not present

## 2022-01-03 DIAGNOSIS — S81012A Laceration without foreign body, left knee, initial encounter: Secondary | ICD-10-CM | POA: Insufficient documentation

## 2022-01-03 DIAGNOSIS — S51812A Laceration without foreign body of left forearm, initial encounter: Secondary | ICD-10-CM | POA: Insufficient documentation

## 2022-01-03 DIAGNOSIS — S0003XA Contusion of scalp, initial encounter: Secondary | ICD-10-CM

## 2022-01-03 DIAGNOSIS — S0081XA Abrasion of other part of head, initial encounter: Secondary | ICD-10-CM | POA: Insufficient documentation

## 2022-01-03 DIAGNOSIS — S4992XA Unspecified injury of left shoulder and upper arm, initial encounter: Secondary | ICD-10-CM | POA: Diagnosis present

## 2022-01-03 DIAGNOSIS — Y92007 Garden or yard of unspecified non-institutional (private) residence as the place of occurrence of the external cause: Secondary | ICD-10-CM | POA: Insufficient documentation

## 2022-01-03 DIAGNOSIS — R4182 Altered mental status, unspecified: Secondary | ICD-10-CM | POA: Diagnosis not present

## 2022-01-03 DIAGNOSIS — S41012A Laceration without foreign body of left shoulder, initial encounter: Secondary | ICD-10-CM | POA: Diagnosis not present

## 2022-01-03 LAB — URINALYSIS, ROUTINE W REFLEX MICROSCOPIC
Bilirubin Urine: NEGATIVE
Glucose, UA: NEGATIVE mg/dL
Ketones, ur: NEGATIVE mg/dL
Leukocytes,Ua: NEGATIVE
Nitrite: NEGATIVE
Protein, ur: NEGATIVE mg/dL
Specific Gravity, Urine: 1.01 (ref 1.005–1.030)
pH: 6.5 (ref 5.0–8.0)

## 2022-01-03 LAB — CBC WITH DIFFERENTIAL/PLATELET
Abs Immature Granulocytes: 0.18 10*3/uL — ABNORMAL HIGH (ref 0.00–0.07)
Basophils Absolute: 0.1 10*3/uL (ref 0.0–0.1)
Basophils Relative: 0 %
Eosinophils Absolute: 0.1 10*3/uL (ref 0.0–0.5)
Eosinophils Relative: 0 %
HCT: 28.8 % — ABNORMAL LOW (ref 36.0–46.0)
Hemoglobin: 9.3 g/dL — ABNORMAL LOW (ref 12.0–15.0)
Immature Granulocytes: 1 %
Lymphocytes Relative: 9 %
Lymphs Abs: 1.5 10*3/uL (ref 0.7–4.0)
MCH: 26.4 pg (ref 26.0–34.0)
MCHC: 32.3 g/dL (ref 30.0–36.0)
MCV: 81.8 fL (ref 80.0–100.0)
Monocytes Absolute: 0.9 10*3/uL (ref 0.1–1.0)
Monocytes Relative: 6 %
Neutro Abs: 13.2 10*3/uL — ABNORMAL HIGH (ref 1.7–7.7)
Neutrophils Relative %: 84 %
Platelets: 556 10*3/uL — ABNORMAL HIGH (ref 150–400)
RBC: 3.52 MIL/uL — ABNORMAL LOW (ref 3.87–5.11)
RDW: 16 % — ABNORMAL HIGH (ref 11.5–15.5)
WBC: 15.8 10*3/uL — ABNORMAL HIGH (ref 4.0–10.5)
nRBC: 0 % (ref 0.0–0.2)

## 2022-01-03 LAB — URINALYSIS, MICROSCOPIC (REFLEX)

## 2022-01-03 LAB — COMPREHENSIVE METABOLIC PANEL
ALT: 28 U/L (ref 0–44)
AST: 23 U/L (ref 15–41)
Albumin: 3 g/dL — ABNORMAL LOW (ref 3.5–5.0)
Alkaline Phosphatase: 83 U/L (ref 38–126)
Anion gap: 9 (ref 5–15)
BUN: 14 mg/dL (ref 8–23)
CO2: 25 mmol/L (ref 22–32)
Calcium: 9 mg/dL (ref 8.9–10.3)
Chloride: 94 mmol/L — ABNORMAL LOW (ref 98–111)
Creatinine, Ser: 0.62 mg/dL (ref 0.44–1.00)
GFR, Estimated: >60 mL/min (ref >60)
Glucose, Bld: 95 mg/dL (ref 70–99)
Potassium: 3.5 mmol/L (ref 3.5–5.1)
Sodium: 128 mmol/L — ABNORMAL LOW (ref 135–145)
Total Bilirubin: 0.5 mg/dL (ref 0.3–1.2)
Total Protein: 6.3 g/dL — ABNORMAL LOW (ref 6.5–8.1)

## 2022-01-03 LAB — TROPONIN I (HIGH SENSITIVITY)
Troponin I (High Sensitivity): 7 ng/L (ref <18)
Troponin I (High Sensitivity): 9 ng/L (ref <18)

## 2022-01-03 LAB — CK: Total CK: 110 U/L (ref 38–234)

## 2022-01-03 LAB — MAGNESIUM: Magnesium: 1.8 mg/dL (ref 1.7–2.4)

## 2022-01-03 MED ORDER — SODIUM CHLORIDE 0.9 % IV BOLUS
1000.0000 mL | Freq: Once | INTRAVENOUS | Status: AC
  Administered 2022-01-03: 1000 mL via INTRAVENOUS

## 2022-01-03 MED ORDER — TETANUS-DIPHTH-ACELL PERTUSSIS 5-2.5-18.5 LF-MCG/0.5 IM SUSY
0.5000 mL | PREFILLED_SYRINGE | Freq: Once | INTRAMUSCULAR | Status: AC
Start: 2022-01-03 — End: 2022-01-03
  Administered 2022-01-03: 0.5 mL via INTRAMUSCULAR
  Filled 2022-01-03: qty 0.5

## 2022-01-03 NOTE — ED Provider Notes (Signed)
[facility] Provider Note   CSN: [id number] Arrival date & time: 01/03/22  1509     History  Chief Complaint  Patient presents with   Lytle Michaels    [name] [name] is a 76 y.o. female history of recurrent UTI, chronic back pain, hypokalemia here presenting with fall and altered mental status.  Patient lives at home by herself but her son comes to visit her every day.  Last time he visited was yesterday.  This afternoon, he found mom on the porch laying down.  Patient apparently had an unwitnessed fall and had a hard time getting up.  Patient unable to tell me how exactly she fell.  Patient also does not know how long she has been out in the heat.  Patient is not currently on blood thinners.  She was noted to have multiple skin tears in the left shoulder and forearm and also bilateral knees.  Patient was recently seen for hypokalemia and states that her potassium is still low.  The history is provided by the patient.       Home Medications Prior to Admission medications   Medication Sig Start Date End Date Taking? Authorizing Provider  albuterol (PROVENTIL HFA;VENTOLIN HFA) 108 (90 BASE) MCG/ACT inhaler Inhale 2 puffs into the lungs every 6 (six) hours as needed for wheezing.    [provider]  alendronate (FOSAMAX) 70 MG tablet Take 70 mg by mouth every 30 (thirty) days. Take with a full glass of water on an empty stomach on Sundays     [provider]  ALPRAZolam Duanne Moron) 0.5 MG tablet Take 0.5 mg by mouth 2 (two) times daily.    [provider]  amoxicillin-clavulanate (AUGMENTIN) 875-125 MG tablet Take 1 tablet by mouth every 12 (twelve) hours. 06/25/18   Blanchie Dessert, MD  Armodafinil (NUVIGIL) 250 MG tablet Take 250 mg by mouth daily.    [provider]  aspirin 81 MG tablet Take 81 mg by mouth every morning. Take an additional tablet if needed for leg cramps in the evening    [provider]  atorvastatin  (LIPITOR) 20 MG tablet Take 20 mg by mouth at bedtime. 01/10/18   [provider]  Bilberry 1000 MG CAPS Take 1 tablet by mouth every morning.     [provider]  budesonide-formoterol (SYMBICORT) 160-4.5 MCG/ACT inhaler Inhale 2 puffs into the lungs daily as needed (for wheezing).    [provider]  buPROPion (WELLBUTRIN SR) 150 MG 12 hr tablet Take 150 mg by mouth 2 (two) times daily.    [provider]  cholecalciferol (VITAMIN D) 1000 UNITS tablet Take 1,000 Units by mouth every morning.    [provider]  docusate sodium (COLACE) 100 MG capsule Take 1 capsule (100 mg total) by mouth every 12 (twelve) hours. 04/23/16   Long, Wonda Olds, MD  DULoxetine (CYMBALTA) 60 MG capsule Take 60 mg by mouth daily.    [provider]  fexofenadine (ALLEGRA) 180 MG tablet TAKE 1 TABLET (180 MG TOTAL) BY MOUTH DAILY. 08/29/17   [provider]  fluticasone (FLONASE) 50 MCG/ACT nasal spray Place 1 spray into both nostrils daily. 06/25/17   Khatri, Hina, PA-C  furosemide (LASIX) 20 MG tablet Take 20 mg by mouth.    [provider]  ibandronate (BONIVA) 150 MG tablet  12/19/17   [provider]  ipratropium (ATROVENT) 0.06 % nasal spray 2 SPRAYS BY NASAL ROUTE 2 (TWO) TIMES DAILY AS NEEDED  FOR UP TO 30 DAYS FOR RHINITIS. 01/21/18   [provider]  lidocaine (LIDODERM) 5 % Place 1 patch onto the skin daily. Remove & Discard patch within 12 hours or as directed by MD 04/23/16   Long, Wonda Olds, MD  Magnesium 500 MG TABS Take 1 tablet by mouth every morning.     [provider]  Multiple Vitamin (MULTIVITAMIN WITH MINERALS) TABS Take 1 tablet by mouth every morning.    [provider]  MYRBETRIQ 50 MG TB24 tablet  12/05/17   [provider]  Olopatadine HCl 0.2 % SOLN  09/13/17   [provider]  oxyCODONE (OXY IR/ROXICODONE) 5 MG immediate release tablet TAKE 1 TABLET BY MOUTH 3 TIMES A DAY AS  NEEDED FOR 30 DAYS 08/07/17   [provider]  pantoprazole (PROTONIX) 40 MG tablet Take 1 tablet (40 mg total) by mouth daily. 11/21/12   Riebock, Estill Bakes, NP  Potassium Chloride ER 20 MEQ TBCR Take 1 tablet by mouth daily. 10/11/17   Hudnall, Sharyn Lull, MD  potassium chloride SA (KLOR-CON M) 20 MEQ tablet Take 2 tablets (40 mEq total) by mouth 2 (two) times daily. 12/24/21   Drenda Freeze, MD  predniSONE (DELTASONE) 20 MG tablet Take 2 tablets (40 mg total) by mouth daily. 08/27/19   Blanchie Dessert, MD  primidone (MYSOLINE) 50 MG tablet PLEASE SEE ATTACHED FOR DETAILED DIRECTIONS 08/07/17   [provider]  sodium chloride (MURO 128) 2 % ophthalmic solution Place 1 drop into both eyes every morning.    [provider]  solifenacin (VESICARE) 10 MG tablet Take by mouth daily.    [provider]  tiZANidine (ZANAFLEX) 4 MG tablet Take 4 mg by mouth every 6 (six) hours as needed for muscle spasms.    [provider]  tolterodine (DETROL) 2 MG tablet Take 2 mg by mouth 2 (two) times daily.    [provider]  triamterene-hydrochlorothiazide (DYAZIDE) 37.5-25 MG per capsule Take 1 capsule by mouth daily.    [provider]  vitamin B-12 (CYANOCOBALAMIN) 1000 MCG tablet Take 1,000 mcg by mouth every morning.     [provider]      Allergies    Doxycycline, Gabapentin, Other, Polymyxin b, and Tape    Review of Systems   Review of Systems  Skin:  Positive for wound.  All other systems reviewed and are negative.   Physical Exam Updated Vital Signs BP (!) 142/103 (BP Location: Right Arm)   Pulse 98   Temp 97.7 F (36.5 C)   Resp 20   Ht '5\' 2"'$  (1.575 m)   Wt 81.6 kg   SpO2 95%   BMI 32.90 kg/m  Physical Exam Vitals and nursing note reviewed.  Constitutional:      Comments: Dehydrated  HENT:     Head: Normocephalic.     Comments: Abrasion left forehead     Nose: Nose normal.     Mouth/Throat:     Mouth: Mucous  membranes are dry.  Eyes:     Extraocular Movements: Extraocular movements intact.     Pupils: Pupils are equal, round, and reactive to light.  Cardiovascular:     Rate and Rhythm: Normal rate and regular rhythm.     Pulses: Normal pulses.     Heart sounds: Normal heart sounds.  Pulmonary:     Effort: Pulmonary effort is normal.     Breath sounds: Normal breath sounds.  Abdominal:     General:  Abdomen is flat.     Palpations: Abdomen is soft.  Musculoskeletal:     Cervical back: Normal range of motion and neck supple.     Comments: Skin tear L shoulder and L forearm and bilateral knees.  No spinal tenderness or deformity.  Normal range of motion bilateral hips.  Skin:    General: Skin is warm.     Capillary Refill: Capillary refill takes less than 2 seconds.  Neurological:     General: No focal deficit present.     Mental Status: She is oriented to person, place, and time.  Psychiatric:        Mood and Affect: Mood normal.        Behavior: Behavior normal.     ED Results / Procedures / Treatments   Labs (all labs ordered are listed, but only abnormal results are displayed) Labs Reviewed  URINE CULTURE  CBC WITH DIFFERENTIAL/PLATELET  COMPREHENSIVE METABOLIC PANEL  CK  MAGNESIUM  URINALYSIS, ROUTINE W REFLEX MICROSCOPIC    EKG None  Radiology No results found.  Procedures Procedures    Medications Ordered in ED Medications  sodium chloride 0.9 % bolus 1,000 mL (has no administration in time range)  Tdap (BOOSTRIX) injection 0.5 mL (has no administration in time range)    ED Course/ Medical Decision Making/ A&P                           Medical Decision Making ZALI KAMAKA is a 76 y.o. female here presenting with fall.  Unclear how she fell today.  Concern for possible heat exhaustion or dehydration or hypokalemia causing her fall.  She has multiple abrasions and skin tears on the left face and left shoulder and forearm and bilateral knees.  We will  get CT head to rule out a bleed and also will get extremity x-rays.  We will check electrolytes and CK level and hydrate patient  7:23 PM I reviewed patient's labs and independently interpreted imaging studies.  Patient's labs show white blood cell count of 16.  However there is no obvious infectious source.  Her urinalysis did not show UTI and her chest x-ray is clear.  Patient's sodium level is 128 which is similar to previously.  Her potassium is now normal.  CT scan showed no fractures and x-rays did not show any fractures.  Patient feeling better after IV fluids.  I updated the son who is at bedside and he states that he can keep an eye on [name] [name]  Problems Addressed: Contusion of scalp, initial encounter: acute illness or injury Fall, initial encounter: acute illness or injury Hyponatremia: acute illness or injury  Amount and/or Complexity of Data Reviewed Labs: ordered. Decision-making details documented in ED Course. Radiology: ordered and independent interpretation performed. Decision-making details documented in ED Course. ECG/medicine tests: ordered and independent interpretation performed. Decision-making details documented in ED Course.  Risk Prescription drug management.    Final Clinical Impression(s) / ED Diagnoses Final diagnoses:  None    Rx / DC Orders ED Discharge Orders     None         Drenda Freeze, MD 01/03/22 [phone]

## 2022-01-03 NOTE — ED Triage Notes (Signed)
Per son pt was found lying on back in yard this afternoon from mechanical fall  Denies LOC Unknown how long  H/o low potassium  Mulitple skin tears to left shoulder, left arm, forehead.  H/o UTI and per son has been off balance

## 2022-01-03 NOTE — Discharge Instructions (Signed)
Your CT scans and x-rays did not show any fractures.  You do not have a urine infection right now  Your sodium level is slightly low and that is likely from dehydration.  Please stay hydrated.  Please have family member keep an eye on you so you don't fall   See your doctor for follow up   Return to ER if you have another fall, confusion

## 2022-01-03 NOTE — ED Notes (Signed)
Pt walked 10f with walker, standby assist

## 2022-01-05 LAB — URINE CULTURE

## 2022-05-03 DEATH — deceased
